# Patient Record
Sex: Male | Born: 1980
Health system: Southern US, Community
[De-identification: ages and names within clinical notes are randomized; demographics above are authoritative.]

---

## 2016-04-18 ENCOUNTER — Emergency Department (HOSPITAL_COMMUNITY)
Admission: EM | Admit: 2016-04-18 | Discharge: 2016-04-18 | Disposition: A | Discharge status: Home / Self Care | Payer: BC Managed Care – PPO | Attending: Emergency Medicine | Admitting: Emergency Medicine

## 2016-04-18 ENCOUNTER — Encounter (HOSPITAL_COMMUNITY): Payer: Self-pay | Admitting: Emergency Medicine

## 2016-04-18 DIAGNOSIS — F17200 Nicotine dependence, unspecified, uncomplicated: Secondary | ICD-10-CM | POA: Diagnosis not present

## 2016-04-18 DIAGNOSIS — T7840XA Allergy, unspecified, initial encounter: Secondary | ICD-10-CM

## 2016-04-18 DIAGNOSIS — Z79899 Other long term (current) drug therapy: Secondary | ICD-10-CM | POA: Diagnosis not present

## 2016-04-18 MED ORDER — EPINEPHRINE 0.3 MG/0.3ML IJ SOAJ
0.3000 mg | Freq: Once | INTRAMUSCULAR | Status: AC
  Administered 2016-04-18: 0.3 mg via INTRAMUSCULAR
  Filled 2016-04-18: qty 0.3

## 2016-04-18 MED ORDER — PREDNISONE 20 MG PO TABS: 40.0000 mg | ORAL_TABLET | Freq: Every day | ORAL | Status: DC

## 2016-04-18 MED ORDER — DIPHENHYDRAMINE HCL 50 MG/ML IJ SOLN
INTRAMUSCULAR | Status: AC
  Administered 2016-04-18: 25 mg via INTRAVENOUS
  Filled 2016-04-18: qty 1

## 2016-04-18 MED ORDER — CETIRIZINE HCL 10 MG PO TABS: 10.0000 mg | ORAL_TABLET | Freq: Every day | ORAL | Status: DC

## 2016-04-18 MED ORDER — METHYLPREDNISOLONE SODIUM SUCC 125 MG IJ SOLR
125.0000 mg | Freq: Once | INTRAMUSCULAR | Status: AC
  Administered 2016-04-18: 125 mg via INTRAVENOUS

## 2016-04-18 MED ORDER — DIPHENHYDRAMINE HCL 50 MG/ML IJ SOLN
25.0000 mg | Freq: Once | INTRAMUSCULAR | Status: AC
  Administered 2016-04-18: 25 mg via INTRAVENOUS

## 2016-04-18 MED ORDER — EPINEPHRINE 0.3 MG/0.3ML IJ SOAJ: 0.3000 mg | Freq: Once | INTRAMUSCULAR | Status: AC

## 2016-04-18 MED ORDER — FAMOTIDINE IN NACL 20-0.9 MG/50ML-% IV SOLN
20.0000 mg | Freq: Once | INTRAVENOUS | Status: AC
  Administered 2016-04-18: 20 mg via INTRAVENOUS
  Filled 2016-04-18: qty 50

## 2016-04-18 MED ORDER — EPINEPHRINE 0.3 MG/0.3ML IJ SOAJ
INTRAMUSCULAR | Status: AC
  Filled 2016-04-18: qty 0.3

## 2016-04-18 MED ORDER — METHYLPREDNISOLONE SODIUM SUCC 125 MG IJ SOLR
INTRAMUSCULAR | Status: AC
  Administered 2016-04-18: 125 mg via INTRAVENOUS
  Filled 2016-04-18: qty 2

## 2016-04-18 NOTE — ED Notes (Signed)
Patient d/c'd self care.  Discussed with family about prescriptions and follow up.  Instructed patient on the use of Epipen.  Patient and family verbalized understanding.

## 2016-04-18 NOTE — ED Provider Notes (Signed)
CSN: [id number]     Arrival date & time 04/18/16  1419 History   First MD Initiated Contact with Patient 04/18/16 1439     Chief Complaint  Patient presents with  . Allergic Reaction     (Consider location/radiation/quality/duration/timing/severity/associated sxs/prior Treatment) HPI Nathan Bryant is a 35 y.o. male who presents for evaluation of allergic reaction. Patient sent here from urgent care facility for evaluation of allergic reaction. Patient reports over the past 3 days he has had bilateral swelling in his eyes. He states that he has seasonal allergies and he has typically had swelling in his eyes and mouth over the past several years when exposed to seasonal allergies, but usually resolves overnight with Benadryl. Current symptoms have not resolved. He denies any mouth swelling, swallowing difficulty, chest pain, shortness of breath, nausea or vomiting, abdominal pain or rash. No vision changes. He denies any other allergies to require an EpiPen. He was seen in urgent care facility, diagnosed with blepharitis, unremarkable fluorescein exam. Given 80 mg Benadryl IM PTA. Patient is in no apparent distress now.  History reviewed. No pertinent past medical history. History reviewed. No pertinent past surgical history. History reviewed. No pertinent family history. Social History  Substance Use Topics  . Smoking status: Light Tobacco Smoker  . Smokeless tobacco: None  . Alcohol Use: None    Review of Systems A 10 point review of systems was completed and was negative except for pertinent positives and negatives as mentioned in the history of present illness     Allergies  Aspirin  Home Medications   Prior to Admission medications   Not on File   BP 154/90 mmHg  Pulse 54  Temp(Src) 98.2 F (36.8 C) (Oral)  Resp 14  SpO2 100% Physical Exam  Constitutional: He is oriented to person, place, and time. He appears well-developed and well-nourished.  HENT:  Head:  Normocephalic and atraumatic.  Mouth/Throat: Oropharynx is clear and moist.  Eyes: Conjunctivae are normal. Pupils are equal, round, and reactive to light. Right eye exhibits no discharge. Left eye exhibits no discharge. No scleral icterus.  Bilateral conjunctival injection, bilateral impressive blepharitis with associated chemosis. Clear watery discharge.  No periorbital tenderness or concern for cellulitis.  Neck: Neck supple.  Cardiovascular: Normal rate, regular rhythm and normal heart sounds.   Pulmonary/Chest: Effort normal and breath sounds normal. No respiratory distress. He has no wheezes. He has no rales.  Abdominal: Soft. There is no tenderness.  Musculoskeletal: He exhibits no tenderness.  Neurological: He is alert and oriented to person, place, and time.  Cranial Nerves II-XII grossly intact  Skin: Skin is warm and dry. No rash noted.  Psychiatric: He has a normal mood and affect.  Nursing note and vitals reviewed.   ED Course  Procedures (including critical care time) Labs Review Labs Reviewed - No data to display  Imaging Review No results found. I have personally reviewed and evaluated these images and lab results as part of my medical decision-making.   EKG Interpretation None     Meds given in ED:  Medications  methylPREDNISolone sodium succinate (SOLU-MEDROL) 125 mg/2 mL injection 125 mg (125 mg Intravenous Given 04/18/16 1540)  famotidine (PEPCID) IVPB 20 mg premix (0 mg Intravenous Stopped 04/18/16 1611)  diphenhydrAMINE (BENADRYL) injection 25 mg (25 mg Intravenous Given 04/18/16 1540)  EPINEPHrine (EPI-PEN) injection 0.3 mg (0.3 mg Intramuscular Given 04/18/16 1619)    Discharge Medication List as of 04/18/2016  6:25 PM    START taking these medications  Details  cetirizine (ZYRTEC ALLERGY) 10 MG tablet Take 1 tablet (10 mg total) by mouth daily., Starting 04/18/2016, Until Discontinued, Print    EPINEPHrine 0.3 mg/0.3 mL IJ SOAJ injection Inject 0.3  mLs (0.3 mg total) into the muscle once., Starting 04/18/2016, Print    predniSONE (DELTASONE) 20 MG tablet Take 2 tablets (40 mg total) by mouth daily., Starting 04/18/2016, Until Discontinued, Print       Filed Vitals:   04/18/16 1819 04/18/16 1824 04/18/16 1922 04/18/16 1924  BP:  146/82 142/82   Pulse: 85 83 68   Temp:    99.1 F (37.3 C)  TempSrc:    Oral  Resp: 16 16    SpO2: 100% 99% 98%     MDM  Patient with apparent allergic reaction to unknown/seasonal allergen? Resulting in blepharitis, chemosis bilaterally. No evidence of pre-or post-septal cellulitis. No evidence of anaphylaxis. Overall appears well. Given Benadryl, Pepcid and Solu-Medrol in the emergency department. He was given 50 mg IM Benadryl at urgent care prior to arrival. Patient reports that he feels his symptoms are improving. He still has gross swelling to bilateral orbits. Discussed with Dr. Rubin Payor, will give epinephrine. Patient given IM 0.3 mg epinephrine at 1619. Reevaluation 1 hour later shows significant improvement in patient's symptoms. Reports he feels much better. Anticipate observation for 3 hours, DC home with steroids, prescription for EpiPen, referral to PCP for further evaluation. Patient reevaluated at 1945, reports he feels much better. Plan to DC home. Final diagnoses:  Allergic reaction, initial encounter        Joycie Peek, PA-C 04/18/16 2020  Benjiman Core, MD 04/18/16 [phone]

## 2016-04-18 NOTE — ED Notes (Signed)
Pt c/o swollen and itchy eyes. Denies swollen throat andSOB. NAD noted. Pt sent from UC where he was given 50mg  Benadryl IM. Pt reports the swelling occurs sometimes after a long walk outside but generally subsides after a day.

## 2016-04-18 NOTE — ED Notes (Addendum)
Patient states that the swelling has gone down.  Patient states "I'm feeling better".  Patient states that has no pain.  Patient states no SOB.  Patient currently watching TV.

## 2016-04-18 NOTE — Discharge Instructions (Signed)
Please take your medications as prescribed. Take your Zyrtec daily for allergies. Take your prednisone to help with inflammation around her eyes. Use your EpiPen as we discussed for severe reactions. Follow-up with your doctor or the [facility] to establish primary care. Return to ED for any new or worsening symptoms as we discussed.  Allergies An allergy is an abnormal reaction to a substance by the body's defense system (immune system). Allergies can develop at any age. WHAT CAUSES ALLERGIES? An allergic reaction happens when the immune system mistakenly reacts to a normally harmless substance, called an allergen, as if it were harmful. The immune system releases antibodies to fight the substance. Antibodies eventually release a chemical called histamine into the bloodstream. The release of histamine is meant to protect the body from infection, but it also causes discomfort. An allergic reaction can be triggered by:  Eating an allergen.  Inhaling an allergen.  Touching an allergen. WHAT TYPES OF ALLERGIES ARE THERE? There are many types of allergies. Common types include:  Seasonal allergies. People with this type of allergy are usually allergic to substances that are only present during certain seasons, such as molds and pollens.  Food allergies.  Drug allergies.  Insect allergies.  Animal dander allergies. WHAT ARE SYMPTOMS OF ALLERGIES? Possible allergy symptoms include:  Swelling of the lips, face, tongue, mouth, or throat.  Sneezing, coughing, or wheezing.  Nasal congestion.  Tingling in the mouth.  Rash.  Itching.  Itchy, red, swollen areas of skin (hives).  Watery eyes.  Vomiting.  Diarrhea.  Dizziness.  Lightheadedness.  Fainting.  Trouble breathing or swallowing.  Chest tightness.  Rapid heartbeat. HOW ARE ALLERGIES DIAGNOSED? Allergies are diagnosed with a medical and family history and one or more of the  following:  Skin tests.  Blood tests.  A food diary. A food diary is a record of all the foods and drinks you have in a day and of all the symptoms you experience.  The results of an elimination diet. An elimination diet involves eliminating foods from your diet and then adding them back in one by one to find out if a certain food causes an allergic reaction. HOW ARE ALLERGIES TREATED? There is no cure for allergies, but allergic reactions can be treated with medicine. Severe reactions usually need to be treated at a hospital. HOW CAN REACTIONS BE PREVENTED? The best way to prevent an allergic reaction is by avoiding the substance you are allergic to. Allergy shots and medicines can also help prevent reactions in some cases. People with severe allergic reactions may be able to prevent a life-threatening reaction called anaphylaxis with a medicine given right after exposure to the allergen.   This information is not intended to replace advice given to you by your health care provider. Make sure you discuss any questions you have with your health care provider.   Document Released: 01/11/2003 Document Revised: 11/08/2014 Document Reviewed: 07/30/2014 Elsevier Interactive Patient Education [employer]2016 [employer]vier Inc.

## 2017-11-06 ENCOUNTER — Other Ambulatory Visit: Payer: Self-pay

## 2017-11-06 ENCOUNTER — Encounter (HOSPITAL_COMMUNITY): Payer: Self-pay

## 2017-11-06 ENCOUNTER — Emergency Department (HOSPITAL_COMMUNITY): Payer: No Typology Code available for payment source

## 2017-11-06 ENCOUNTER — Emergency Department (HOSPITAL_COMMUNITY)
Admission: EM | Admit: 2017-11-06 | Discharge: 2017-11-06 | Disposition: A | Discharge status: Home / Self Care | Payer: No Typology Code available for payment source | Attending: Emergency Medicine | Admitting: Emergency Medicine

## 2017-11-06 DIAGNOSIS — M25562 Pain in left knee: Secondary | ICD-10-CM | POA: Diagnosis not present

## 2017-11-06 DIAGNOSIS — F17200 Nicotine dependence, unspecified, uncomplicated: Secondary | ICD-10-CM | POA: Diagnosis not present

## 2017-11-06 DIAGNOSIS — R51 Headache: Secondary | ICD-10-CM | POA: Diagnosis not present

## 2017-11-06 DIAGNOSIS — Z79899 Other long term (current) drug therapy: Secondary | ICD-10-CM | POA: Diagnosis not present

## 2017-11-06 MED ORDER — ACETAMINOPHEN 325 MG PO TABS
650.0000 mg | ORAL_TABLET | Freq: Once | ORAL | Status: AC
  Administered 2017-11-06: 650 mg via ORAL
  Filled 2017-11-06: qty 2

## 2017-11-06 MED ORDER — FLUTICASONE PROPIONATE 50 MCG/ACT NA SUSP: 1.0000 | Freq: Every day | NASAL | 2 refills | Status: DC
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
## 2018-11-14 ENCOUNTER — Encounter: Payer: Self-pay | Admitting: Pediatric Intensive Care

## 2018-11-14 ENCOUNTER — Other Ambulatory Visit: Payer: Self-pay | Admitting: Critical Care Medicine

## 2018-11-14 MED ORDER — AMLODIPINE BESYLATE 10 MG PO TABS
10.0000 mg | ORAL_TABLET | Freq: Every day | ORAL | 0 refills | Status: DC
Start: 2018-11-14 — End: 2020-02-21

## 2018-11-14 MED ORDER — HYDROCHLOROTHIAZIDE 25 MG PO TABS: 25.0000 mg | ORAL_TABLET | Freq: Every day | ORAL | 0 refills | Status: DC

## 2018-11-14 MED FILL — AMLODIPINE BESYLATE 10 MG T: 10 | 30 days supply | Qty: 30 | Fill #0

## 2018-11-14 MED FILL — HYDROCHLOROTHIAZIDE 25 MG T: 25 | 30 days supply | Qty: 30 | Fill #0

## 2018-11-14 NOTE — Progress Notes (Signed)
Congregational nursing called. Pt with new dx HTN  Needing meds  Rx amlodipine 10mg  /d and HCTZ 25mg  /d  Sent to [facility] outpt pharmacy.  F/u with [place] PCP ASAP

## 2018-11-15 NOTE — Congregational Nurse Program (Signed)
  Dept: [phone]   Congregational Nurse Program Note  Date of Encounter: 11/14/2018  Past Medical History: No past medical history on file.  Encounter Details: New client encounter. Client seeks information regarding referral for PCP. Client states that he currently does not have insurance and missed the [employer] sign up. Client endorses multiple life stressors at present. He states he copes with those by drinking "3-4 shots" sometimes but also goes for walks. Client states he realizes this may not be an effective way to cope with stress. He also states he is having difficulty sleeping. He denies SI/HI but states he is very "sad but doesn't want to be". Client states family history of hypertension. Dr Delford Field notified of BP, CN gave medication to client with directions for use. Client to return to clinic on Tuesday 1/21 to recheck BP and to discuss referral for counseling services. Client verbalizes instructions. Shann Medal RN BSN CNP [phone]

## 2018-11-21 ENCOUNTER — Telehealth: Payer: Self-pay

## 2018-11-21 NOTE — Telephone Encounter (Signed)
Request received from [facility], RN/CN for an appointment for the patient to establish care.   Informed her that an appointment has been scheduled for 12/11/2018 @ 1530

## 2018-11-22 ENCOUNTER — Telehealth: Payer: Self-pay | Admitting: Pediatric Intensive Care

## 2018-11-22 NOTE — Telephone Encounter (Signed)
Call to client to follow up regarding coming to clinic for BP check. Client states he will be able to come to clinic at [facility] Thursday 11/23/2018.

## 2018-12-11 ENCOUNTER — Ambulatory Visit: Payer: Self-pay | Admitting: Internal Medicine
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
## 2019-11-09 ENCOUNTER — Ambulatory Visit: Payer: Self-pay | Admitting: Family Medicine

## 2020-02-21 ENCOUNTER — Other Ambulatory Visit: Payer: Self-pay

## 2020-02-21 ENCOUNTER — Ambulatory Visit (INDEPENDENT_AMBULATORY_CARE_PROVIDER_SITE_OTHER): Payer: 59 | Admitting: Family Medicine

## 2020-02-21 ENCOUNTER — Encounter: Payer: Self-pay | Admitting: Family Medicine

## 2020-02-21 VITALS — BP 170/108 | HR 72 | Temp 96.9°F | Ht 75.0 in | Wt 218.0 lb

## 2020-02-21 DIAGNOSIS — F418 Other specified anxiety disorders: Secondary | ICD-10-CM | POA: Diagnosis not present

## 2020-02-21 DIAGNOSIS — I10 Essential (primary) hypertension: Secondary | ICD-10-CM | POA: Diagnosis not present

## 2020-02-21 DIAGNOSIS — L723 Sebaceous cyst: Secondary | ICD-10-CM | POA: Diagnosis not present

## 2020-02-21 DIAGNOSIS — Z0000 Encounter for general adult medical examination without abnormal findings: Secondary | ICD-10-CM | POA: Diagnosis not present

## 2020-02-21 LAB — COMPREHENSIVE METABOLIC PANEL
ALT: 25 U/L (ref 0–53)
AST: 25 U/L (ref 0–37)
Albumin: 4.7 g/dL (ref 3.5–5.2)
Alkaline Phosphatase: 59 U/L (ref 39–117)
BUN: 8 mg/dL (ref 6–23)
CO2: 26 mEq/L (ref 19–32)
Calcium: 9.5 mg/dL (ref 8.4–10.5)
Chloride: 104 mEq/L (ref 96–112)
Creatinine, Ser: 1 mg/dL (ref 0.40–1.50)
GFR: 83.36 mL/min (ref >60.00)
Glucose, Bld: 89 mg/dL (ref 70–99)
Potassium: 3.9 mEq/L (ref 3.5–5.1)
Sodium: 139 mEq/L (ref 135–145)
Total Bilirubin: 0.8 mg/dL (ref 0.2–1.2)
Total Protein: 7.4 g/dL (ref 6.0–8.3)

## 2020-02-21 LAB — URINALYSIS, ROUTINE W REFLEX MICROSCOPIC
Bilirubin Urine: NEGATIVE
Hgb urine dipstick: NEGATIVE
Ketones, ur: NEGATIVE
Leukocytes,Ua: NEGATIVE
Nitrite: NEGATIVE
RBC / HPF: NONE SEEN (ref 0–?)
Specific Gravity, Urine: 1.01 (ref 1.000–1.030)
Total Protein, Urine: NEGATIVE
Urine Glucose: NEGATIVE
Urobilinogen, UA: 0.2 (ref 0.0–1.0)
WBC, UA: NONE SEEN (ref 0–?)
pH: 7 (ref 5.0–8.0)

## 2020-02-21 LAB — CBC
HCT: 43.8 % (ref 39.0–52.0)
Hemoglobin: 14.7 g/dL (ref 13.0–17.0)
MCHC: 33.6 g/dL (ref 30.0–36.0)
MCV: 93.4 fl (ref 78.0–100.0)
Platelets: 190 10*3/uL (ref 150.0–400.0)
RBC: 4.69 Mil/uL (ref 4.22–5.81)
RDW: 14 % (ref 11.5–15.5)
WBC: 4 10*3/uL (ref 4.0–10.5)

## 2020-02-21 LAB — LIPID PANEL
Cholesterol: 133 mg/dL (ref 0–200)
HDL: 60.9 mg/dL (ref >39.00)
LDL Cholesterol: 63 mg/dL (ref 0–99)
NonHDL: 71.87
Total CHOL/HDL Ratio: 2
Triglycerides: 44 mg/dL (ref 0.0–149.0)
VLDL: 8.8 mg/dL (ref 0.0–40.0)

## 2020-02-21 LAB — TSH: TSH: 0.71 u[IU]/mL (ref 0.35–4.50)

## 2020-02-21 MED ORDER — ESCITALOPRAM OXALATE 10 MG PO TABS
ORAL_TABLET | ORAL | 1 refills | Status: DC
Start: 2020-02-21 — End: 2020-03-28

## 2020-02-21 MED ORDER — AMLODIPINE BESYLATE 10 MG PO TABS: 10.0000 mg | ORAL_TABLET | Freq: Every day | ORAL | 3 refills | Status: DC

## 2020-02-21 NOTE — Patient Instructions (Signed)
Managing Your Hypertension Hypertension is commonly called high blood pressure. This is when the force of your blood pressing against the walls of your arteries is too strong. Arteries are blood vessels that carry blood from your heart throughout your body. Hypertension forces the heart to work harder to pump blood, and may cause the arteries to become narrow or stiff. Having untreated or uncontrolled hypertension can cause heart attack, stroke, kidney disease, and other problems. What are blood pressure readings? A blood pressure reading consists of a higher number over a lower number. Ideally, your blood pressure should be below 120/80. The first ("top") number is called the systolic pressure. It is a measure of the pressure in your arteries as your heart beats. The second ("bottom") number is called the diastolic pressure. It is a measure of the pressure in your arteries as the heart relaxes. What does my blood pressure reading mean? Blood pressure is classified into four stages. Based on your blood pressure reading, your health care provider may use the following stages to determine what type of treatment you need, if any. Systolic pressure and diastolic pressure are measured in a unit called mm Hg. Normal  Systolic pressure: below 119.  Diastolic pressure: below 80. Elevated  Systolic pressure: 417-408.  Diastolic pressure: below 80. Hypertension stage 1  Systolic pressure: 144-818.  Diastolic pressure: 56-31. Hypertension stage 2  Systolic pressure: 497 or above.  Diastolic pressure: 90 or above. What health risks are associated with hypertension? Managing your hypertension is an important responsibility. Uncontrolled hypertension can lead to:  A heart attack.  A stroke.  A weakened blood vessel (aneurysm).  Heart failure.  Kidney damage.  Eye damage.  Metabolic syndrome.  Memory and concentration problems. What changes can I make to manage my  hypertension? Hypertension can be managed by making lifestyle changes and possibly by taking medicines. Your health care provider will help you make a plan to bring your blood pressure within a normal range. Eating and drinking   Eat a diet that is high in fiber and potassium, and low in salt (sodium), added sugar, and fat. An example eating plan is called the DASH (Dietary Approaches to Stop Hypertension) diet. To eat this way: ? Eat plenty of fresh fruits and vegetables. Try to fill half of your plate at each meal with fruits and vegetables. ? Eat whole grains, such as whole wheat pasta, brown rice, or whole grain bread. Fill about one quarter of your plate with whole grains. ? Eat low-fat diary products. ? Avoid fatty cuts of meat, processed or cured meats, and poultry with skin. Fill about one quarter of your plate with lean proteins such as fish, chicken without skin, beans, eggs, and tofu. ? Avoid premade and processed foods. These tend to be higher in sodium, added sugar, and fat.  Reduce your daily sodium intake. Most people with hypertension should eat less than 1,500 mg of sodium a day.  Limit alcohol intake to no more than 1 drink a day for nonpregnant women and 2 drinks a day for men. One drink equals 12 oz of beer, 5 oz of wine, or 1 oz of hard liquor. Lifestyle  Work with your health care provider to maintain a healthy body weight, or to lose weight. Ask what an ideal weight is for you.  Get at least 30 minutes of exercise that causes your heart to beat faster (aerobic exercise) most days of the week. Activities may include walking, swimming, or biking.  Include exercise  to strengthen your muscles (resistance exercise), such as weight lifting, as part of your weekly exercise routine. Try to do these types of exercises for 30 minutes at least 3 days a week.  Do not use any products that contain nicotine or tobacco, such as cigarettes and e-cigarettes. If you need help quitting,  ask your health care provider.  Control any long-term (chronic) conditions you have, such as high cholesterol or diabetes. Monitoring  Monitor your blood pressure at home as told by your health care provider. Your personal target blood pressure may vary depending on your medical conditions, your age, and other factors.  Have your blood pressure checked regularly, as often as told by your health care provider. Working with your health care provider  Review all the medicines you take with your health care provider because there may be side effects or interactions.  Talk with your health care provider about your diet, exercise habits, and other lifestyle factors that may be contributing to hypertension.  Visit your health care provider regularly. Your health care provider can help you create and adjust your plan for managing hypertension. Will I need medicine to control my blood pressure? Your health care provider may prescribe medicine if lifestyle changes are not enough to get your blood pressure under control, and if:  Your systolic blood pressure is 130 or higher.  Your diastolic blood pressure is 80 or higher. Take medicines only as told by your health care provider. Follow the directions carefully. Blood pressure medicines must be taken as prescribed. The medicine does not work as well when you skip doses. Skipping doses also puts you at risk for problems. Contact a health care provider if:  You think you are having a reaction to medicines you have taken.  You have repeated (recurrent) headaches.  You feel dizzy.  You have swelling in your ankles.  You have trouble with your vision. Get help right away if:  You develop a severe headache or confusion.  You have unusual weakness or numbness, or you feel faint.  You have severe pain in your chest or abdomen.  You vomit repeatedly.  You have trouble breathing. Summary  Hypertension is when the force of blood pumping  through your arteries is too strong. If this condition is not controlled, it may put you at risk for serious complications.  Your personal target blood pressure may vary depending on your medical conditions, your age, and other factors. For most people, a normal blood pressure is less than 120/80.  Hypertension is managed by lifestyle changes, medicines, or both. Lifestyle changes include weight loss, eating a healthy, low-sodium diet, exercising more, and limiting alcohol. This information is not intended to replace advice given to you by your health care provider. Make sure you discuss any questions you have with your health care provider. Document Revised: 02/09/2019 Document Reviewed: 09/15/2016 Elsevier Patient Education  2020 [employer]  Preventive Care 21-39 Years Old, Male Preventive care refers to lifestyle choices and visits with your health care provider that can promote health and wellness. This includes:  A yearly physical exam. This is also called an annual well check.  Regular dental and eye exams.  Immunizations.  Screening for certain conditions.  Healthy lifestyle choices, such as eating a healthy diet, getting regular exercise, not using drugs or products that contain nicotine and tobacco, and limiting alcohol use. What can I expect for my preventive care visit? Physical exam Your health care provider will check:  Height and weight. These   may be used to calculate body mass index (BMI), which is a measurement that tells if you are at a healthy weight.  Heart rate and blood pressure.  Your skin for abnormal spots. Counseling Your health care provider may ask you questions about:  Alcohol, tobacco, and drug use.  Emotional well-being.  Home and relationship well-being.  Sexual activity.  Eating habits.  Work and work Statistician. What immunizations do I need?  Influenza (flu) vaccine  This is recommended every year. Tetanus, diphtheria, and  pertussis (Tdap) vaccine  You may need a Td booster every 10 years. Varicella (chickenpox) vaccine  You may need this vaccine if you have not already been vaccinated. Human papillomavirus (HPV) vaccine  If recommended by your health care provider, you may need three doses over 6 months. Measles, mumps, and rubella (MMR) vaccine  You may need at least one dose of MMR. You may also need a second dose. Meningococcal conjugate (MenACWY) vaccine  One dose is recommended if you are 43-9 years old and a Market researcher living in a residence hall, or if you have one of several medical conditions. You may also need additional booster doses. Pneumococcal conjugate (PCV13) vaccine  You may need this if you have certain conditions and were not previously vaccinated. Pneumococcal polysaccharide (PPSV23) vaccine  You may need one or two doses if you smoke cigarettes or if you have certain conditions. Hepatitis A vaccine  You may need this if you have certain conditions or if you travel or work in places where you may be exposed to hepatitis A. Hepatitis B vaccine  You may need this if you have certain conditions or if you travel or work in places where you may be exposed to hepatitis B. Haemophilus influenzae type b (Hib) vaccine  You may need this if you have certain risk factors. You may receive vaccines as individual doses or as more than one vaccine together in one shot (combination vaccines). Talk with your health care provider about the risks and benefits of combination vaccines. What tests do I need? Blood tests  Lipid and cholesterol levels. These may be checked every 5 years starting at age 56.  Hepatitis C test.  Hepatitis B test. Screening   Diabetes screening. This is done by checking your blood sugar (glucose) after you have not eaten for a while (fasting).  Sexually transmitted disease (STD) testing. Talk with your health care provider about your test results,  treatment options, and if necessary, the need for more tests. Follow these instructions at home: Eating and drinking   Eat a diet that includes fresh fruits and vegetables, whole grains, lean protein, and low-fat dairy products.  Take vitamin and mineral supplements as recommended by your health care provider.  Do not drink alcohol if your health care provider tells you not to drink.  If you drink alcohol: ? Limit how much you have to 0-2 drinks a day. ? Be aware of how much alcohol is in your drink. In the [place] one drink equals one 12 oz bottle of beer (355 mL), one 5 oz glass of wine (148 mL), or one 1 oz glass of hard liquor (44 mL). Lifestyle  Take daily care of your teeth and gums.  Stay active. Exercise for at least 30 minutes on 5 or more days each week.  Do not use any products that contain nicotine or tobacco, such as cigarettes, e-cigarettes, and chewing tobacco. If you need help quitting, ask your health care provider.  If you are sexually active, practice safe sex. Use a condom or other form of protection to prevent STIs (sexually transmitted infections). What's next?  Go to your health care provider once a year for a well check visit.  Ask your health care provider how often you should have your eyes and teeth checked.  Stay up to date on all vaccines. This information is not intended to replace advice given to you by your health care provider. Make sure you discuss any questions you have with your health care provider. Document Revised: 10/12/2018 Document Reviewed: 10/12/2018 Elsevier Patient Education  2020 [employer]  Health Maintenance, Male Adopting a healthy lifestyle and getting preventive care are important in promoting health and wellness. Ask your health care provider about:  The right schedule for you to have regular tests and exams.  Things you can do on your own to prevent diseases and keep yourself healthy. What should I know about diet,  weight, and exercise? Eat a healthy diet   Eat a diet that includes plenty of vegetables, fruits, low-fat dairy products, and lean protein.  Do not eat a lot of foods that are high in solid fats, added sugars, or sodium. Maintain a healthy weight Body mass index (BMI) is a measurement that can be used to identify possible weight problems. It estimates body fat based on height and weight. Your health care provider can help determine your BMI and help you achieve or maintain a healthy weight. Get regular exercise Get regular exercise. This is one of the most important things you can do for your health. Most adults should:  Exercise for at least 150 minutes each week. The exercise should increase your heart rate and make you sweat (moderate-intensity exercise).  Do strengthening exercises at least twice a week. This is in addition to the moderate-intensity exercise.  Spend less time sitting. Even light physical activity can be beneficial. Watch cholesterol and blood lipids Have your blood tested for lipids and cholesterol at 39 years of age, then have this test every 5 years. You may need to have your cholesterol levels checked more often if:  Your lipid or cholesterol levels are high.  You are older than 40 years of age.  You are at high risk for heart disease. What should I know about cancer screening? Many types of cancers can be detected early and may often be prevented. Depending on your health history and family history, you may need to have cancer screening at various ages. This may include screening for:  Colorectal cancer.  Prostate cancer.  Skin cancer.  Lung cancer. What should I know about heart disease, diabetes, and high blood pressure? Blood pressure and heart disease  High blood pressure causes heart disease and increases the risk of stroke. This is more likely to develop in people who have high blood pressure readings, are of African descent, or are  overweight.  Talk with your health care provider about your target blood pressure readings.  Have your blood pressure checked: ? Every 3-5 years if you are 18-39 years of age. ? Every year if you are 40 years old or older.  If you are between the ages of 65 and 75 and are a current or former smoker, ask your health care provider if you should have a one-time screening for abdominal aortic aneurysm (AAA). Diabetes Have regular diabetes screenings. This checks your fasting blood sugar level. Have the screening done:  Once every three years after age 45 if you are   at a normal weight and have a low risk for diabetes.  More often and at a younger age if you are overweight or have a high risk for diabetes. What should I know about preventing infection? Hepatitis B If you have a higher risk for hepatitis B, you should be screened for this virus. Talk with your health care provider to find out if you are at risk for hepatitis B infection. Hepatitis C Blood testing is recommended for:  Everyone born from [birth year] through 1965.  Anyone with known risk factors for hepatitis C. Sexually transmitted infections (STIs)  You should be screened each year for STIs, including gonorrhea and chlamydia, if: ? You are sexually active and are younger than 39 years of age. ? You are older than 39 years of age and your health care provider tells you that you are at risk for this type of infection. ? Your sexual activity has changed since you were last screened, and you are at increased risk for chlamydia or gonorrhea. Ask your health care provider if you are at risk.  Ask your health care provider about whether you are at high risk for HIV. Your health care provider may recommend a prescription medicine to help prevent HIV infection. If you choose to take medicine to prevent HIV, you should first get tested for HIV. You should then be tested every 3 months for as long as you are taking the medicine. Follow these  instructions at home: Lifestyle  Do not use any products that contain nicotine or tobacco, such as cigarettes, e-cigarettes, and chewing tobacco. If you need help quitting, ask your health care provider.  Do not use street drugs.  Do not share needles.  Ask your health care provider for help if you need support or information about quitting drugs. Alcohol use  Do not drink alcohol if your health care provider tells you not to drink.  If you drink alcohol: ? Limit how much you have to 0-2 drinks a day. ? Be aware of how much alcohol is in your drink. In the [place] one drink equals one 12 oz bottle of beer (355 mL), one 5 oz glass of wine (148 mL), or one 1 oz glass of hard liquor (44 mL). General instructions  Schedule regular health, dental, and eye exams.  Stay current with your vaccines.  Tell your health care provider if: ? You often feel depressed. ? You have ever been abused or do not feel safe at home. Summary  Adopting a healthy lifestyle and getting preventive care are important in promoting health and wellness.  Follow your health care provider's instructions about healthy diet, exercising, and getting tested or screened for diseases.  Follow your health care provider's instructions on monitoring your cholesterol and blood pressure. This information is not intended to replace advice given to you by your health care provider. Make sure you discuss any questions you have with your health care provider. Document Revised: 10/11/2018 Document Reviewed: 10/11/2018 Elsevier Patient Education  2020 [employer].

## 2020-02-21 NOTE — Progress Notes (Signed)
New Patient Office Visit  Subjective:  Patient ID: Nathan Bryant, male    DOB: 1981-06-15  Age: 39 y.o. MRN: [id number]  CC:  Chief Complaint  Patient presents with  . Establish Care    New patient, CPE    HPI Nathan Bryant presents for establishment of care.  He has been very sad distressed and anxious over the last few years due to a falling out with his significant other.  He had emigrated from [place] 5 years ago to be with this person.  It sounds as though they were unable to have children and this led to some stress in the relationship.  He has been estranged from her over the last few years.  She had placed a restraining order and there has been some legal consequences from his continued attempts to contact her.  He is seeking help with this.  Significant history of hypertension that has been treated with amlodipine in the past.  History reviewed. No pertinent past medical history.  History reviewed. No pertinent surgical history.  Family History  Problem Relation Age of Onset  . Hypertension Mother     Social History   Socioeconomic History  . Marital status: Married    Spouse name: Not on file  . Number of children: Not on file  . Years of education: Not on file  . Highest education level: Not on file  Occupational History  . Not on file  Tobacco Use  . Smoking status: Light Tobacco Smoker  . Smokeless tobacco: Never Used  Substance and Sexual Activity  . Alcohol use: Yes    Alcohol/week: 2.0 standard drinks    Types: 1 Glasses of wine, 1 Shots of liquor per week  . Drug use: Yes    Types: Marijuana  . Sexual activity: Not on file  Other Topics Concern  . Not on file  Social History Narrative  . Not on file   Social Determinants of Health   Financial Resource Strain:   . Difficulty of Paying Living Expenses:   Food Insecurity:   . Worried About Charity fundraiser in the Last Year:   . Arboriculturist in the Last Year:   Transportation Needs:   .  Film/video editor (Medical):   Marland Kitchen Lack of Transportation (Non-Medical):   Physical Activity:   . Days of Exercise per Week:   . Minutes of Exercise per Session:   Stress:   . Feeling of Stress :   Social Connections:   . Frequency of Communication with Friends and Family:   . Frequency of Social Gatherings with Friends and Family:   . Attends Religious Services:   . Active Member of Clubs or Organizations:   . Attends Archivist Meetings:   Marland Kitchen Marital Status:   Intimate Partner Violence:   . Fear of Current or Ex-Partner:   . Emotionally Abused:   Marland Kitchen Physically Abused:   . Sexually Abused:     ROS Review of Systems  Constitutional: Negative for chills, diaphoresis, fatigue, fever and unexpected weight change.  HENT: Negative.   Eyes: Negative for photophobia and visual disturbance.  Respiratory: Negative.   Cardiovascular: Negative.   Gastrointestinal: Negative.   Endocrine: Negative for polyphagia and polyuria.  Genitourinary: Negative.   Musculoskeletal: Negative for gait problem and joint swelling.  Skin: Negative for pallor.  Allergic/Immunologic: Negative for immunocompromised state.  Neurological: Negative for speech difficulty and weakness.  Hematological: Does not bruise/bleed easily.  Psychiatric/Behavioral: Positive for  decreased concentration. Negative for self-injury. The patient is nervous/anxious.    Depression screen [facility] 2/9 02/21/2020 02/21/2020  Decreased Interest 3 1  Down, Depressed, Hopeless 3 2  PHQ - 2 Score 6 3  Altered sleeping 1 -  Tired, decreased energy 1 -  Change in appetite 1 -  Feeling bad or failure about yourself  3 -  Trouble concentrating 1 -  Moving slowly or fidgety/restless 1 -  Suicidal thoughts 0 -  PHQ-9 Score 14 -  Difficult doing work/chores Somewhat difficult -     Objective:   Today's Vitals: BP (!) 170/108   Pulse 72   Temp (!) 96.9 F (36.1 C) (Tympanic)   Ht 6\' 3"  (1.905 m)   Wt 218 lb (98.9 kg)    SpO2 98%   BMI 27.25 kg/m   Physical Exam Vitals and nursing note reviewed.  Constitutional:      General: He is not in acute distress.    Appearance: Normal appearance. He is normal weight. He is not toxic-appearing or diaphoretic.  HENT:     Head: Normocephalic and atraumatic.     Right Ear: Tympanic membrane and ear canal normal.     Left Ear: Tympanic membrane and ear canal normal.  Eyes:     General:        Right eye: No discharge.        Left eye: No discharge.     Extraocular Movements: Extraocular movements intact.     Conjunctiva/sclera: Conjunctivae normal.     Pupils: Pupils are equal, round, and reactive to light.  Neck:     Vascular: No carotid bruit.  Cardiovascular:     Rate and Rhythm: Normal rate and regular rhythm.  Pulmonary:     Effort: Pulmonary effort is normal.     Breath sounds: Normal breath sounds.  Abdominal:     General: Abdomen is flat. Bowel sounds are normal. There is no distension.     Palpations: Abdomen is soft. There is no mass.     Tenderness: There is no abdominal tenderness. There is no guarding or rebound.     Hernia: No hernia is present. There is no hernia in the left inguinal area or right inguinal area.  Genitourinary:    Penis: Circumcised. No hypospadias, erythema, tenderness, discharge, swelling or lesions.      Testes:        Right: Mass, tenderness or swelling not present. Right testis is descended.        Left: Swelling not present. Left testis is descended.    Musculoskeletal:     Cervical back: Normal range of motion and neck supple. No rigidity or tenderness.     Right lower leg: No edema.     Left lower leg: No edema.  Lymphadenopathy:     Cervical: No cervical adenopathy.     Lower Body: No right inguinal adenopathy. No left inguinal adenopathy.  Skin:    General: Skin is warm and dry.  Neurological:     Mental Status: He is alert and oriented to person, place, and time.  Psychiatric:        Mood and Affect: Mood  normal.        Behavior: Behavior normal.     Assessment & Plan:   Problem List Items Addressed This Visit      Cardiovascular and Mediastinum   Essential hypertension - Primary   Relevant Medications   amLODipine (NORVASC) 10 MG tablet   Other Relevant Orders  CBC   Comprehensive metabolic panel   Urinalysis, Routine w reflex microscopic     Other   Depression with anxiety   Relevant Medications   escitalopram (LEXAPRO) 10 MG tablet   Other Relevant Orders   Ambulatory referral to Psychology   Healthcare maintenance   Relevant Orders   HIV Antibody (routine testing w rflx)   Lipid panel   TSH   Urinalysis, Routine w reflex microscopic      Outpatient Encounter Medications as of 02/21/2020  Medication Sig  . Ascorbic Acid (VITAMIN C PO) Take 2 tablets by mouth daily.  . cetirizine (ZYRTEC ALLERGY) 10 MG tablet Take 1 tablet (10 mg total) by mouth daily.  . Tetrahydrozoline HCl (VISINE OP) Apply 1 drop to eye daily as needed (eye irritation).  Marland Kitchen amLODipine (NORVASC) 10 MG tablet Take 1 tablet (10 mg total) by mouth daily.  Marland Kitchen amoxicillin (AMOXIL) 500 MG capsule Take 500 mg by mouth daily.  . CHLORPHENIRAMINE MALEATE PO Take 1 tablet by mouth daily as needed (allergies).  . diphenhydrAMINE (BENADRYL) 25 mg capsule Take 25 mg by mouth 2 (two) times daily as needed for allergies.  Marland Kitchen EPINEPHrine 0.3 mg/0.3 mL IJ SOAJ injection Inject 0.3 mLs (0.3 mg total) into the muscle once. (Patient not taking: Reported on 02/21/2020)  . escitalopram (LEXAPRO) 10 MG tablet Take one daily  . fluticasone (FLONASE) 50 MCG/ACT nasal spray Place 1 spray into both nostrils daily. (Patient not taking: Reported on 02/21/2020)  . hydrochlorothiazide (HYDRODIURIL) 25 MG tablet Take 1 tablet (25 mg total) by mouth daily. (Patient not taking: Reported on 02/21/2020)  . predniSONE (DELTASONE) 20 MG tablet Take 2 tablets (40 mg total) by mouth daily. (Patient not taking: Reported on 02/21/2020)  .  [DISCONTINUED] amLODipine (NORVASC) 10 MG tablet Take 1 tablet (10 mg total) by mouth daily. (Patient not taking: Reported on 02/21/2020)   No facility-administered encounter medications on file as of 02/21/2020.    Follow-up: Return in about 1 month (around 03/22/2020).   Patient agrees to go for counseling.  We will start Lexapro.  Fasting blood work drawn today.  Given information on health maintenance preventative care managing hypertension.  Is much is anything patient needs talking therapy at this point.  Mliss Sax, MD

## 2020-02-22 LAB — HIV ANTIBODY (ROUTINE TESTING W REFLEX): HIV 1&2 Ab, 4th Generation: NONREACTIVE

## 2020-03-28 ENCOUNTER — Other Ambulatory Visit: Payer: Self-pay

## 2020-03-28 ENCOUNTER — Ambulatory Visit: Payer: 59 | Admitting: Family Medicine

## 2020-03-28 ENCOUNTER — Encounter: Payer: Self-pay | Admitting: Family Medicine

## 2020-03-28 VITALS — BP 130/98 | HR 71 | Temp 98.4°F | Ht 75.0 in | Wt 220.6 lb

## 2020-03-28 DIAGNOSIS — L723 Sebaceous cyst: Secondary | ICD-10-CM | POA: Diagnosis not present

## 2020-03-28 DIAGNOSIS — J301 Allergic rhinitis due to pollen: Secondary | ICD-10-CM | POA: Diagnosis not present

## 2020-03-28 DIAGNOSIS — F418 Other specified anxiety disorders: Secondary | ICD-10-CM | POA: Diagnosis not present

## 2020-03-28 DIAGNOSIS — I10 Essential (primary) hypertension: Secondary | ICD-10-CM | POA: Diagnosis not present

## 2020-03-28 MED ORDER — ESCITALOPRAM OXALATE 20 MG PO TABS: 20.0000 mg | ORAL_TABLET | Freq: Every day | ORAL | 2 refills | Status: DC

## 2020-03-28 MED ORDER — PREDNISONE 10 MG PO TABS: 10.0000 mg | ORAL_TABLET | Freq: Every day | ORAL | 0 refills | Status: DC

## 2020-03-28 MED ORDER — PREDNISONE 10 MG PO TABS: 10.0000 mg | ORAL_TABLET | Freq: Every day | ORAL | 0 refills | Status: AC

## 2020-03-28 NOTE — Patient Instructions (Signed)
   Managing Your Hypertension Hypertension is commonly called high blood pressure. This is when the force of your blood pressing against the walls of your arteries is too strong. Arteries are blood vessels that carry blood from your heart throughout your body. Hypertension forces the heart to work harder to pump blood, and may cause the arteries to become narrow or stiff. Having untreated or uncontrolled hypertension can cause heart attack, stroke, kidney disease, and other problems. What are blood pressure readings? A blood pressure reading consists of a higher number over a lower number. Ideally, your blood pressure should be below 120/80. The first ("top") number is called the systolic pressure. It is a measure of the pressure in your arteries as your heart beats. The second ("bottom") number is called the diastolic pressure. It is a measure of the pressure in your arteries as the heart relaxes. What does my blood pressure reading mean? Blood pressure is classified into four stages. Based on your blood pressure reading, your health care provider may use the following stages to determine what type of treatment you need, if any. Systolic pressure and diastolic pressure are measured in a unit called mm Hg. Normal  Systolic pressure: below 120.  Diastolic pressure: below 80. Elevated  Systolic pressure: 120-129.  Diastolic pressure: below 80. Hypertension stage 1  Systolic pressure: 130-139.  Diastolic pressure: 80-89. Hypertension stage 2  Systolic pressure: 140 or above.  Diastolic pressure: 90 or above. What health risks are associated with hypertension? Managing your hypertension is an important responsibility. Uncontrolled hypertension can lead to:  A heart attack.  A stroke.  A weakened blood vessel (aneurysm).  Heart failure.  Kidney damage.  Eye damage.  Metabolic syndrome.  Memory and concentration problems. What changes can I make to manage my  hypertension? Hypertension can be managed by making lifestyle changes and possibly by taking medicines. Your health care provider will help you make a plan to bring your blood pressure within a normal range. Eating and drinking   Eat a diet that is high in fiber and potassium, and low in salt (sodium), added sugar, and fat. An example eating plan is called the DASH (Dietary Approaches to Stop Hypertension) diet. To eat this way: ? Eat plenty of fresh fruits and vegetables. Try to fill half of your plate at each meal with fruits and vegetables. ? Eat whole grains, such as whole wheat pasta, brown rice, or whole grain bread. Fill about one quarter of your plate with whole grains. ? Eat low-fat diary products. ? Avoid fatty cuts of meat, processed or cured meats, and poultry with skin. Fill about one quarter of your plate with lean proteins such as fish, chicken without skin, beans, eggs, and tofu. ? Avoid premade and processed foods. These tend to be higher in sodium, added sugar, and fat.  Reduce your daily sodium intake. Most people with hypertension should eat less than 1,500 mg of sodium a day.  Limit alcohol intake to no more than 1 drink a day for nonpregnant women and 2 drinks a day for men. One drink equals 12 oz of beer, 5 oz of wine, or 1 oz of hard liquor. Lifestyle  Work with your health care provider to maintain a healthy body weight, or to lose weight. Ask what an ideal weight is for you.  Get at least 30 minutes of exercise that causes your heart to beat faster (aerobic exercise) most days of the week. Activities may include walking, swimming, or biking.    Include exercise to strengthen your muscles (resistance exercise), such as weight lifting, as part of your weekly exercise routine. Try to do these types of exercises for 30 minutes at least 3 days a week.  Do not use any products that contain nicotine or tobacco, such as cigarettes and e-cigarettes. If you need help quitting,  ask your health care provider.  Control any long-term (chronic) conditions you have, such as high cholesterol or diabetes. Monitoring  Monitor your blood pressure at home as told by your health care provider. Your personal target blood pressure may vary depending on your medical conditions, your age, and other factors.  Have your blood pressure checked regularly, as often as told by your health care provider. Working with your health care provider  Review all the medicines you take with your health care provider because there may be side effects or interactions.  Talk with your health care provider about your diet, exercise habits, and other lifestyle factors that may be contributing to hypertension.  Visit your health care provider regularly. Your health care provider can help you create and adjust your plan for managing hypertension. Will I need medicine to control my blood pressure? Your health care provider may prescribe medicine if lifestyle changes are not enough to get your blood pressure under control, and if:  Your systolic blood pressure is 130 or higher.  Your diastolic blood pressure is 80 or higher. Take medicines only as told by your health care provider. Follow the directions carefully. Blood pressure medicines must be taken as prescribed. The medicine does not work as well when you skip doses. Skipping doses also puts you at risk for problems. Contact a health care provider if:  You think you are having a reaction to medicines you have taken.  You have repeated (recurrent) headaches.  You feel dizzy.  You have swelling in your ankles.  You have trouble with your vision. Get help right away if:  You develop a severe headache or confusion.  You have unusual weakness or numbness, or you feel faint.  You have severe pain in your chest or abdomen.  You vomit repeatedly.  You have trouble breathing. Summary  Hypertension is when the force of blood pumping  through your arteries is too strong. If this condition is not controlled, it may put you at risk for serious complications.  Your personal target blood pressure may vary depending on your medical conditions, your age, and other factors. For most people, a normal blood pressure is less than 120/80.  Hypertension is managed by lifestyle changes, medicines, or both. Lifestyle changes include weight loss, eating a healthy, low-sodium diet, exercising more, and limiting alcohol. This information is not intended to replace advice given to you by your health care provider. Make sure you discuss any questions you have with your health care provider. Document Revised: 02/09/2019 Document Reviewed: 09/15/2016 Elsevier Patient Education  2020 Elsevier Inc[employer]  

## 2020-03-28 NOTE — Progress Notes (Signed)
Established Patient Office Visit  Subjective:  Patient ID: Nathan Bryant, male    DOB: 06-08-1981  Age: 39 y.o. MRN: [id number]  CC:  Chief Complaint  Patient presents with  . Hypertension    1 month follow up.  Pt c/o lower back of the head pain on both sides.  Constant pain, effecting  work.  Pt explains that head feel heavey during work.  Pt also said that he has not been able to get in touch with previous referral office.    HPI Nathan Bryant presents for follow-up of his depression with anxiety.  Lexapro has helped but he is still feeling anxious and depressed.  He has not heard from psychology for an appointment.  He assures me that his phone number is correct and that his messaging system is working on his phone.  He would like to see somebody who could possibly remove the cyst from his scrotum.  He has been taking his amlodipine.  He is not taking HCTZ.  Tells of ongoing itchy watery eyes postnasal drip headache and sneezing.  Zyrtec seems to help.  He has been taking ibuprofen regularly for the headache.  Headache is nonprogressive.  It is not associated with prodromal symptoms nausea vomiting or light sensitivity.  He has no headache history. History reviewed. No pertinent past medical history.  History reviewed. No pertinent surgical history.  Family History  Problem Relation Age of Onset  . Hypertension Mother     Social History   Socioeconomic History  . Marital status: Married    Spouse name: Not on file  . Number of children: Not on file  . Years of education: Not on file  . Highest education level: Not on file  Occupational History  . Not on file  Tobacco Use  . Smoking status: Light Tobacco Smoker  . Smokeless tobacco: Never Used  Substance and Sexual Activity  . Alcohol use: Yes    Alcohol/week: 2.0 standard drinks    Types: 1 Glasses of wine, 1 Shots of liquor per week  . Drug use: Yes    Types: Marijuana  . Sexual activity: Not on file  Other Topics  Concern  . Not on file  Social History Narrative  . Not on file   Social Determinants of Health   Financial Resource Strain:   . Difficulty of Paying Living Expenses:   Food Insecurity:   . Worried About Programme researcher, broadcasting/film/video in the Last Year:   . Barista in the Last Year:   Transportation Needs:   . Freight forwarder (Medical):   Marland Kitchen Lack of Transportation (Non-Medical):   Physical Activity:   . Days of Exercise per Week:   . Minutes of Exercise per Session:   Stress:   . Feeling of Stress :   Social Connections:   . Frequency of Communication with Friends and Family:   . Frequency of Social Gatherings with Friends and Family:   . Attends Religious Services:   . Active Member of Clubs or Organizations:   . Attends Banker Meetings:   Marland Kitchen Marital Status:   Intimate Partner Violence:   . Fear of Current or Ex-Partner:   . Emotionally Abused:   Marland Kitchen Physically Abused:   . Sexually Abused:     Outpatient Medications Prior to Visit  Medication Sig Dispense Refill  . amLODipine (NORVASC) 10 MG tablet Take 1 tablet (10 mg total) by mouth daily. 90 tablet 3  . Ascorbic  Acid (VITAMIN C PO) Take 2 tablets by mouth daily.    . cetirizine (ZYRTEC ALLERGY) 10 MG tablet Take 1 tablet (10 mg total) by mouth daily. 30 tablet 1  . CHLORPHENIRAMINE MALEATE PO Take 1 tablet by mouth daily as needed (allergies).    . diphenhydrAMINE (BENADRYL) 25 mg capsule Take 25 mg by mouth 2 (two) times daily as needed for allergies.    Marland Kitchen EPINEPHrine 0.3 mg/0.3 mL IJ SOAJ injection Inject 0.3 mLs (0.3 mg total) into the muscle once. 1 Device 1  . fluticasone (FLONASE) 50 MCG/ACT nasal spray Place 1 spray into both nostrils daily. 16 g 2  . escitalopram (LEXAPRO) 10 MG tablet Take one daily 30 tablet 1  . predniSONE (DELTASONE) 20 MG tablet Take 2 tablets (40 mg total) by mouth daily. 10 tablet 0  . amoxicillin (AMOXIL) 500 MG capsule Take 500 mg by mouth daily.    . Tetrahydrozoline  HCl (VISINE OP) Apply 1 drop to eye daily as needed (eye irritation).    . hydrochlorothiazide (HYDRODIURIL) 25 MG tablet Take 1 tablet (25 mg total) by mouth daily. (Patient not taking: Reported on 03/28/2020) 30 tablet 0   No facility-administered medications prior to visit.    Allergies  Allergen Reactions  . Aspirin Anaphylaxis  . Shellfish Allergy Hives and Rash    ROS Review of Systems  Constitutional: Negative.   HENT: Positive for congestion, postnasal drip and sneezing.   Eyes: Positive for itching. Negative for photophobia.  Respiratory: Negative.   Cardiovascular: Negative.   Gastrointestinal: Negative.   Genitourinary: Negative.   Musculoskeletal: Negative for gait problem and joint swelling.  Skin: Negative for pallor and rash.  Neurological: Positive for headaches.  Psychiatric/Behavioral: Positive for dysphoric mood and sleep disturbance. Negative for self-injury and suicidal ideas.      Objective:    Physical Exam  Constitutional: He is oriented to person, place, and time. He appears well-developed and well-nourished. No distress.  HENT:  Head: Normocephalic and atraumatic.  Right Ear: External ear normal.  Left Ear: External ear normal.  Eyes: Conjunctivae are normal. Right eye exhibits no discharge. Left eye exhibits no discharge. No scleral icterus.  Neck: No JVD present. No tracheal deviation present.  Pulmonary/Chest: Effort normal. No stridor.  Neurological: He is alert and oriented to person, place, and time.  Skin: Skin is warm and dry. He is not diaphoretic.  Psychiatric: He has a normal mood and affect. His behavior is normal.    BP (!) 130/98 (BP Location: Left Arm, Patient Position: Sitting, Cuff Size: Normal)   Pulse 71   Temp 98.4 F (36.9 C) (Temporal)   Ht 6\' 3"  (1.905 m)   Wt 220 lb 9.6 oz (100.1 kg)   SpO2 97%   BMI 27.57 kg/m  Wt Readings from Last 3 Encounters:  03/28/20 220 lb 9.6 oz (100.1 kg)  02/21/20 218 lb (98.9 kg)      Health Maintenance Due  Topic Date Due  . COVID-19 Vaccine (1) Never done    There are no preventive care reminders to display for this patient.  Lab Results  Component Value Date   TSH 0.71 02/21/2020   Lab Results  Component Value Date   WBC 4.0 02/21/2020   HGB 14.7 02/21/2020   HCT 43.8 02/21/2020   MCV 93.4 02/21/2020   PLT 190.0 02/21/2020   Lab Results  Component Value Date   NA 139 02/21/2020   K 3.9 02/21/2020   CO2 26 02/21/2020  GLUCOSE 89 02/21/2020   BUN 8 02/21/2020   CREATININE 1.00 02/21/2020   BILITOT 0.8 02/21/2020   ALKPHOS 59 02/21/2020   AST 25 02/21/2020   ALT 25 02/21/2020   PROT 7.4 02/21/2020   ALBUMIN 4.7 02/21/2020   CALCIUM 9.5 02/21/2020   GFR 83.36 02/21/2020   Lab Results  Component Value Date   CHOL 133 02/21/2020   Lab Results  Component Value Date   HDL 60.90 02/21/2020   Lab Results  Component Value Date   LDLCALC 63 02/21/2020   Lab Results  Component Value Date   TRIG 44.0 02/21/2020   Lab Results  Component Value Date   CHOLHDL 2 02/21/2020   No results found for: HGBA1C    Assessment & Plan:   Problem List Items Addressed This Visit      Cardiovascular and Mediastinum   Essential hypertension     Respiratory   Seasonal allergic rhinitis due to pollen   Relevant Medications   predniSONE (DELTASONE) 10 MG tablet     Musculoskeletal and Integument   Scrotal sebaceous cyst   Relevant Orders   Ambulatory referral to Urology     Other   Depression with anxiety - Primary   Relevant Medications   escitalopram (LEXAPRO) 20 MG tablet   Other Relevant Orders   Ambulatory referral to Psychology      Meds ordered this encounter  Medications  . escitalopram (LEXAPRO) 20 MG tablet    Sig: Take 1 tablet (20 mg total) by mouth daily.    Dispense:  30 tablet    Refill:  2  . predniSONE (DELTASONE) 10 MG tablet    Sig: Take 1 tablet (10 mg total) by mouth daily with breakfast for 7 days.     Dispense:  7 tablet    Refill:  0    Follow-up: Return in about 5 weeks (around 05/02/2020).   Have increased Lexapro to 20 mg daily.  Patient will continue Zyrtec and have added prednisone for his allergies.  Continue Norvasc.   Mliss Sax, MD

## 2020-03-28 NOTE — Addendum Note (Signed)
Addended by: Andrez Grime on: 03/28/2020 11:32 AM   Modules accepted: Orders

## 2020-05-02 ENCOUNTER — Encounter: Payer: Self-pay | Admitting: Family Medicine

## 2020-05-02 ENCOUNTER — Other Ambulatory Visit: Payer: Self-pay

## 2020-05-02 ENCOUNTER — Ambulatory Visit: Payer: 59 | Admitting: Family Medicine

## 2020-05-02 VITALS — BP 152/106 | HR 62 | Temp 97.5°F | Ht 75.0 in | Wt 227.4 lb

## 2020-05-02 DIAGNOSIS — I10 Essential (primary) hypertension: Secondary | ICD-10-CM

## 2020-05-02 DIAGNOSIS — F418 Other specified anxiety disorders: Secondary | ICD-10-CM

## 2020-05-02 MED ORDER — ESCITALOPRAM OXALATE 20 MG PO TABS: 20.0000 mg | ORAL_TABLET | Freq: Every day | ORAL | 2 refills | Status: DC

## 2020-05-02 MED ORDER — CHLORTHALIDONE 25 MG PO TABS: 25.0000 mg | ORAL_TABLET | Freq: Every day | ORAL | 2 refills | Status: DC

## 2020-05-02 NOTE — Progress Notes (Signed)
Established Patient Office Visit  Subjective:  Patient ID: Nathan Bryant, male    DOB: Jan 11, 1981  Age: 39 y.o. MRN: [id number]  CC:  Chief Complaint  Patient presents with  . Follow-up    1 month follow up on BP, depression and cyst,  no concerns    HPI Nathan Bryant presents for follow-up of his depression with anxiety and hypertension.  Feeling much better with regards to his anxiety and depression.  Feels like the Lexapro is helping.  Has been taking his Norvasc daily except on the days that he does choose to drink.  Assures me he is not drinking excessively.  He has found the counseling at his church.  No past medical history on file.  No past surgical history on file.  Family History  Problem Relation Age of Onset  . Hypertension Mother     Social History   Socioeconomic History  . Marital status: Married    Spouse name: Not on file  . Number of children: Not on file  . Years of education: Not on file  . Highest education level: Not on file  Occupational History  . Not on file  Tobacco Use  . Smoking status: Light Tobacco Smoker  . Smokeless tobacco: Never Used  Vaping Use  . Vaping Use: Never used  Substance and Sexual Activity  . Alcohol use: Yes    Alcohol/week: 2.0 standard drinks    Types: 1 Glasses of wine, 1 Shots of liquor per week  . Drug use: Yes    Types: Marijuana  . Sexual activity: Not on file  Other Topics Concern  . Not on file  Social History Narrative  . Not on file   Social Determinants of Health   Financial Resource Strain:   . Difficulty of Paying Living Expenses:   Food Insecurity:   . Worried About Programme researcher, broadcasting/film/video in the Last Year:   . Barista in the Last Year:   Transportation Needs:   . Freight forwarder (Medical):   Marland Kitchen Lack of Transportation (Non-Medical):   Physical Activity:   . Days of Exercise per Week:   . Minutes of Exercise per Session:   Stress:   . Feeling of Stress :   Social Connections:     . Frequency of Communication with Friends and Family:   . Frequency of Social Gatherings with Friends and Family:   . Attends Religious Services:   . Active Member of Clubs or Organizations:   . Attends Banker Meetings:   Marland Kitchen Marital Status:   Intimate Partner Violence:   . Fear of Current or Ex-Partner:   . Emotionally Abused:   Marland Kitchen Physically Abused:   . Sexually Abused:     Outpatient Medications Prior to Visit  Medication Sig Dispense Refill  . amLODipine (NORVASC) 10 MG tablet Take 1 tablet (10 mg total) by mouth daily. 90 tablet 3  . Ascorbic Acid (VITAMIN C PO) Take 2 tablets by mouth daily.    . CHLORPHENIRAMINE MALEATE PO Take 1 tablet by mouth daily as needed (allergies).    . diphenhydrAMINE (BENADRYL) 25 mg capsule Take 25 mg by mouth 2 (two) times daily as needed for allergies.    Marland Kitchen EPINEPHrine 0.3 mg/0.3 mL IJ SOAJ injection Inject 0.3 mLs (0.3 mg total) into the muscle once. 1 Device 1  . escitalopram (LEXAPRO) 20 MG tablet Take 1 tablet (20 mg total) by mouth daily. 30 tablet 2  . amoxicillin (  AMOXIL) 500 MG capsule Take 500 mg by mouth daily. (Patient not taking: Reported on 05/02/2020)    . cetirizine (ZYRTEC ALLERGY) 10 MG tablet Take 1 tablet (10 mg total) by mouth daily. (Patient not taking: Reported on 05/02/2020) 30 tablet 1  . fluticasone (FLONASE) 50 MCG/ACT nasal spray Place 1 spray into both nostrils daily. (Patient not taking: Reported on 05/02/2020) 16 g 2  . Tetrahydrozoline HCl (VISINE OP) Apply 1 drop to eye daily as needed (eye irritation). (Patient not taking: Reported on 05/02/2020)     No facility-administered medications prior to visit.    Allergies  Allergen Reactions  . Aspirin Anaphylaxis  . Shellfish Allergy Hives and Rash    ROS Review of Systems  Constitutional: Negative.   HENT: Negative.   Eyes: Negative for photophobia and visual disturbance.  Respiratory: Negative.   Cardiovascular: Negative.   Endocrine: Negative for  polyphagia and polyuria.  Genitourinary: Negative.   Neurological: Negative for light-headedness and headaches.   Depression screen [facility] 2/9 05/02/2020 03/28/2020 02/21/2020  Decreased Interest 0 0 3  Down, Depressed, Hopeless 0 3 3  PHQ - 2 Score 0 3 6  Altered sleeping 0 2 1  Tired, decreased energy 0 0 1  Change in appetite 0 2 1  Feeling bad or failure about yourself  1 2 3   Trouble concentrating 0 3 1  Moving slowly or fidgety/restless 1 3 1   Suicidal thoughts 0 0 0  PHQ-9 Score 2 15 14   Difficult doing work/chores Not difficult at all Somewhat difficult Somewhat difficult      Objective:    Physical Exam Vitals and nursing note reviewed.  Constitutional:      General: He is not in acute distress.    Appearance: Normal appearance. He is not ill-appearing or toxic-appearing.  HENT:     Right Ear: External ear normal.     Left Ear: External ear normal.  Eyes:     General: No scleral icterus.       Right eye: No discharge.        Left eye: No discharge.     Conjunctiva/sclera: Conjunctivae normal.  Pulmonary:     Effort: Pulmonary effort is normal.  Skin:    General: Skin is warm and dry.  Neurological:     Mental Status: He is alert and oriented to person, place, and time.  Psychiatric:        Mood and Affect: Mood normal.        Behavior: Behavior normal.     BP (!) 152/106   Pulse 62   Temp (!) 97.5 F (36.4 C) (Tympanic)   Ht 6\' 3"  (1.905 m)   Wt 227 lb 6.4 oz (103.1 kg)   SpO2 98%   BMI 28.42 kg/m  Wt Readings from Last 3 Encounters:  05/02/20 227 lb 6.4 oz (103.1 kg)  03/28/20 220 lb 9.6 oz (100.1 kg)  02/21/20 218 lb (98.9 kg)     Health Maintenance Due  Topic Date Due  . Hepatitis C Screening  Never done  . COVID-19 Vaccine (1) Never done    There are no preventive care reminders to display for this patient.  Lab Results  Component Value Date   TSH 0.71 02/21/2020   Lab Results  Component Value Date   WBC 4.0 02/21/2020   HGB 14.7  02/21/2020   HCT 43.8 02/21/2020   MCV 93.4 02/21/2020   PLT 190.0 02/21/2020   Lab Results  Component Value Date  NA 139 02/21/2020   K 3.9 02/21/2020   CO2 26 02/21/2020   GLUCOSE 89 02/21/2020   BUN 8 02/21/2020   CREATININE 1.00 02/21/2020   BILITOT 0.8 02/21/2020   ALKPHOS 59 02/21/2020   AST 25 02/21/2020   ALT 25 02/21/2020   PROT 7.4 02/21/2020   ALBUMIN 4.7 02/21/2020   CALCIUM 9.5 02/21/2020   GFR 83.36 02/21/2020   Lab Results  Component Value Date   CHOL 133 02/21/2020   Lab Results  Component Value Date   HDL 60.90 02/21/2020   Lab Results  Component Value Date   LDLCALC 63 02/21/2020   Lab Results  Component Value Date   TRIG 44.0 02/21/2020   Lab Results  Component Value Date   CHOLHDL 2 02/21/2020   No results found for: HGBA1C    Assessment & Plan:   Problem List Items Addressed This Visit      Cardiovascular and Mediastinum   Essential hypertension   Relevant Medications   chlorthalidone (HYGROTON) 25 MG tablet     Other   Depression with anxiety - Primary   Relevant Medications   escitalopram (LEXAPRO) 20 MG tablet      Meds ordered this encounter  Medications  . escitalopram (LEXAPRO) 20 MG tablet    Sig: Take 1 tablet (20 mg total) by mouth daily.    Dispense:  30 tablet    Refill:  2  . chlorthalidone (HYGROTON) 25 MG tablet    Sig: Take 1 tablet (25 mg total) by mouth daily.    Dispense:  30 tablet    Refill:  2    Follow-up: Return in about 2 months (around 07/03/2020), or Take blood pressure medicines and lexapro daily. check and record blood pressures..   Given information on managing her blood pressure.  Advised him to take all medicines every day and moderate alcohol to no more than 2 drinks daily.  We will have his significant other check and record his blood pressures periodically. Mliss Sax, MD

## 2020-05-02 NOTE — Patient Instructions (Signed)
   Managing Your Hypertension Hypertension is commonly called high blood pressure. This is when the force of your blood pressing against the walls of your arteries is too strong. Arteries are blood vessels that carry blood from your heart throughout your body. Hypertension forces the heart to work harder to pump blood, and may cause the arteries to become narrow or stiff. Having untreated or uncontrolled hypertension can cause heart attack, stroke, kidney disease, and other problems. What are blood pressure readings? A blood pressure reading consists of a higher number over a lower number. Ideally, your blood pressure should be below 120/80. The first ("top") number is called the systolic pressure. It is a measure of the pressure in your arteries as your heart beats. The second ("bottom") number is called the diastolic pressure. It is a measure of the pressure in your arteries as the heart relaxes. What does my blood pressure reading mean? Blood pressure is classified into four stages. Based on your blood pressure reading, your health care provider may use the following stages to determine what type of treatment you need, if any. Systolic pressure and diastolic pressure are measured in a unit called mm Hg. Normal  Systolic pressure: below 120.  Diastolic pressure: below 80. Elevated  Systolic pressure: 120-129.  Diastolic pressure: below 80. Hypertension stage 1  Systolic pressure: 130-139.  Diastolic pressure: 80-89. Hypertension stage 2  Systolic pressure: 140 or above.  Diastolic pressure: 90 or above. What health risks are associated with hypertension? Managing your hypertension is an important responsibility. Uncontrolled hypertension can lead to:  A heart attack.  A stroke.  A weakened blood vessel (aneurysm).  Heart failure.  Kidney damage.  Eye damage.  Metabolic syndrome.  Memory and concentration problems. What changes can I make to manage my  hypertension? Hypertension can be managed by making lifestyle changes and possibly by taking medicines. Your health care provider will help you make a plan to bring your blood pressure within a normal range. Eating and drinking   Eat a diet that is high in fiber and potassium, and low in salt (sodium), added sugar, and fat. An example eating plan is called the DASH (Dietary Approaches to Stop Hypertension) diet. To eat this way: ? Eat plenty of fresh fruits and vegetables. Try to fill half of your plate at each meal with fruits and vegetables. ? Eat whole grains, such as whole wheat pasta, brown rice, or whole grain bread. Fill about one quarter of your plate with whole grains. ? Eat low-fat diary products. ? Avoid fatty cuts of meat, processed or cured meats, and poultry with skin. Fill about one quarter of your plate with lean proteins such as fish, chicken without skin, beans, eggs, and tofu. ? Avoid premade and processed foods. These tend to be higher in sodium, added sugar, and fat.  Reduce your daily sodium intake. Most people with hypertension should eat less than 1,500 mg of sodium a day.  Limit alcohol intake to no more than 1 drink a day for nonpregnant women and 2 drinks a day for men. One drink equals 12 oz of beer, 5 oz of wine, or 1 oz of hard liquor. Lifestyle  Work with your health care provider to maintain a healthy body weight, or to lose weight. Ask what an ideal weight is for you.  Get at least 30 minutes of exercise that causes your heart to beat faster (aerobic exercise) most days of the week. Activities may include walking, swimming, or biking.    Include exercise to strengthen your muscles (resistance exercise), such as weight lifting, as part of your weekly exercise routine. Try to do these types of exercises for 30 minutes at least 3 days a week.  Do not use any products that contain nicotine or tobacco, such as cigarettes and e-cigarettes. If you need help quitting,  ask your health care provider.  Control any long-term (chronic) conditions you have, such as high cholesterol or diabetes. Monitoring  Monitor your blood pressure at home as told by your health care provider. Your personal target blood pressure may vary depending on your medical conditions, your age, and other factors.  Have your blood pressure checked regularly, as often as told by your health care provider. Working with your health care provider  Review all the medicines you take with your health care provider because there may be side effects or interactions.  Talk with your health care provider about your diet, exercise habits, and other lifestyle factors that may be contributing to hypertension.  Visit your health care provider regularly. Your health care provider can help you create and adjust your plan for managing hypertension. Will I need medicine to control my blood pressure? Your health care provider may prescribe medicine if lifestyle changes are not enough to get your blood pressure under control, and if:  Your systolic blood pressure is 130 or higher.  Your diastolic blood pressure is 80 or higher. Take medicines only as told by your health care provider. Follow the directions carefully. Blood pressure medicines must be taken as prescribed. The medicine does not work as well when you skip doses. Skipping doses also puts you at risk for problems. Contact a health care provider if:  You think you are having a reaction to medicines you have taken.  You have repeated (recurrent) headaches.  You feel dizzy.  You have swelling in your ankles.  You have trouble with your vision. Get help right away if:  You develop a severe headache or confusion.  You have unusual weakness or numbness, or you feel faint.  You have severe pain in your chest or abdomen.  You vomit repeatedly.  You have trouble breathing. Summary  Hypertension is when the force of blood pumping  through your arteries is too strong. If this condition is not controlled, it may put you at risk for serious complications.  Your personal target blood pressure may vary depending on your medical conditions, your age, and other factors. For most people, a normal blood pressure is less than 120/80.  Hypertension is managed by lifestyle changes, medicines, or both. Lifestyle changes include weight loss, eating a healthy, low-sodium diet, exercising more, and limiting alcohol. This information is not intended to replace advice given to you by your health care provider. Make sure you discuss any questions you have with your health care provider. Document Revised: 02/09/2019 Document Reviewed: 09/15/2016 Elsevier Patient Education  2020 Elsevier Inc[employer]  

## 2020-07-04 ENCOUNTER — Telehealth: Payer: Self-pay | Admitting: Family Medicine

## 2020-07-04 ENCOUNTER — Ambulatory Visit: Payer: 59 | Admitting: Family Medicine

## 2020-07-04 NOTE — Telephone Encounter (Signed)
Patient is requesting a refill for prednisone, escitalopram and hydrochlorothiazide sent to [facility] on [address], please advise. Pt has an appointment on 9/17.  CB [phone]

## 2020-07-09 ENCOUNTER — Other Ambulatory Visit: Payer: Self-pay

## 2020-07-09 DIAGNOSIS — F418 Other specified anxiety disorders: Secondary | ICD-10-CM

## 2020-07-09 DIAGNOSIS — I10 Essential (primary) hypertension: Secondary | ICD-10-CM

## 2020-07-09 MED ORDER — CHLORTHALIDONE 25 MG PO TABS: 25.0000 mg | ORAL_TABLET | Freq: Every day | ORAL | 2 refills | Status: DC

## 2020-07-09 MED ORDER — ESCITALOPRAM OXALATE 20 MG PO TABS: 20.0000 mg | ORAL_TABLET | Freq: Every day | ORAL | 2 refills | Status: DC

## 2020-07-09 MED ORDER — ESCITALOPRAM OXALATE 20 MG PO TABS: 20.0000 mg | ORAL_TABLET | Freq: Every day | ORAL | 0 refills | Status: DC

## 2020-07-09 NOTE — Telephone Encounter (Signed)
Spoke with patient Rx refills sent in. Patient aware that prednisone was sent in for allergies and to be nly taken for 7 days no need for refill on this but others refilled.

## 2020-07-09 NOTE — Telephone Encounter (Signed)
Refill request on pending medication last OV 05/02/20 appointment scheduled for follow up on 07/18/20. Please advise

## 2020-07-09 NOTE — Telephone Encounter (Signed)
Called patient to go over requested refills on medications, no answer LMTCB

## 2020-07-18 ENCOUNTER — Encounter: Payer: Self-pay | Admitting: Family Medicine

## 2020-07-18 ENCOUNTER — Ambulatory Visit (INDEPENDENT_AMBULATORY_CARE_PROVIDER_SITE_OTHER): Payer: 59 | Admitting: Family Medicine

## 2020-07-18 VITALS — BP 132/86 | HR 63 | Temp 97.8°F | Ht 75.0 in | Wt 230.8 lb

## 2020-07-18 DIAGNOSIS — I10 Essential (primary) hypertension: Secondary | ICD-10-CM | POA: Diagnosis not present

## 2020-07-18 DIAGNOSIS — F418 Other specified anxiety disorders: Secondary | ICD-10-CM | POA: Diagnosis not present

## 2020-07-18 MED ORDER — AMLODIPINE BESYLATE 10 MG PO TABS: 10.0000 mg | ORAL_TABLET | Freq: Every day | ORAL | 1 refills | Status: DC

## 2020-07-18 MED ORDER — CHLORTHALIDONE 25 MG PO TABS: 25.0000 mg | ORAL_TABLET | Freq: Every day | ORAL | 1 refills | Status: DC

## 2020-07-18 MED ORDER — ESCITALOPRAM OXALATE 20 MG PO TABS: 20.0000 mg | ORAL_TABLET | Freq: Every day | ORAL | 1 refills | Status: DC

## 2020-07-18 NOTE — Progress Notes (Signed)
Established Patient Office Visit  Subjective:  Patient ID: Nathan Bryant, male    DOB: 07/25/1981  Age: 39 y.o. MRN: [id number]  CC:  Chief Complaint  Patient presents with  . Follow-up    follow up on BP c/o pinching headache on the back right side of head that come and go.     HPI [name] presents for follow-up of hypertension and anxiety with depression.  Blood pressure has been controlled with amlodipine and the chlorthalidone.  He denies trouble taking these medicines.  No swelling in his lower extremities.  He has noted a pain in the back of his head and neck on his missed doses blood pressure medicines.  He does not have a problem with this issue while taking his medications.  Continues with the Lexapro anxiety and depression has responded well to it.  His outlook is much improved taking this medication.  He would like to continue it.  No past medical history on file.  No past surgical history on file.  Family History  Problem Relation Age of Onset  . Hypertension Mother     Social History   Socioeconomic History  . Marital status: Married    Spouse name: Not on file  . Number of children: Not on file  . Years of education: Not on file  . Highest education level: Not on file  Occupational History  . Not on file  Tobacco Use  . Smoking status: Light Tobacco Smoker  . Smokeless tobacco: Never Used  Vaping Use  . Vaping Use: Never used  Substance and Sexual Activity  . Alcohol use: Yes    Alcohol/week: 2.0 standard drinks    Types: 1 Glasses of wine, 1 Shots of liquor per week  . Drug use: Yes    Types: Marijuana  . Sexual activity: Not on file  Other Topics Concern  . Not on file  Social History Narrative  . Not on file   Social Determinants of Health   Financial Resource Strain:   . Difficulty of Paying Living Expenses: Not on file  Food Insecurity:   . Worried About Programme researcher, broadcasting/film/video in the Last Year: Not on file  . Ran Out of Food in the  Last Year: Not on file  Transportation Needs:   . Lack of Transportation (Medical): Not on file  . Lack of Transportation (Non-Medical): Not on file  Physical Activity:   . Days of Exercise per Week: Not on file  . Minutes of Exercise per Session: Not on file  Stress:   . Feeling of Stress : Not on file  Social Connections:   . Frequency of Communication with Friends and Family: Not on file  . Frequency of Social Gatherings with Friends and Family: Not on file  . Attends Religious Services: Not on file  . Active Member of Clubs or Organizations: Not on file  . Attends Banker Meetings: Not on file  . Marital Status: Not on file  Intimate Partner Violence:   . Fear of Current or Ex-Partner: Not on file  . Emotionally Abused: Not on file  . Physically Abused: Not on file  . Sexually Abused: Not on file    Outpatient Medications Prior to Visit  Medication Sig Dispense Refill  . Ascorbic Acid (VITAMIN C PO) Take 2 tablets by mouth daily.    . CHLORPHENIRAMINE MALEATE PO Take 1 tablet by mouth daily as needed (allergies).    . EPINEPHrine 0.3 mg/0.3 mL IJ  SOAJ injection Inject 0.3 mLs (0.3 mg total) into the muscle once. 1 Device 1  . amLODipine (NORVASC) 10 MG tablet Take 1 tablet (10 mg total) by mouth daily. 90 tablet 3  . chlorthalidone (HYGROTON) 25 MG tablet Take 1 tablet (25 mg total) by mouth daily. 30 tablet 2  . escitalopram (LEXAPRO) 20 MG tablet Take 1 tablet (20 mg total) by mouth daily. 30 tablet 0  . diphenhydrAMINE (BENADRYL) 25 mg capsule Take 25 mg by mouth 2 (two) times daily as needed for allergies. (Patient not taking: Reported on 07/18/2020)     No facility-administered medications prior to visit.    Allergies  Allergen Reactions  . Aspirin Anaphylaxis  . Shellfish Allergy Hives and Rash    ROS Review of Systems  Constitutional: Negative.   HENT: Negative.   Eyes: Negative for photophobia and visual disturbance.  Respiratory: Negative.     Cardiovascular: Negative.   Gastrointestinal: Negative.   Endocrine: Negative for polyphagia and polyuria.  Genitourinary: Negative.   Musculoskeletal: Positive for neck pain.  Neurological: Positive for headaches. Negative for light-headedness and numbness.  Hematological: Does not bruise/bleed easily.  Psychiatric/Behavioral: Negative.       Objective:    Physical Exam Vitals and nursing note reviewed.  Constitutional:      General: He is not in acute distress.    Appearance: Normal appearance. He is normal weight. He is not ill-appearing, toxic-appearing or diaphoretic.  HENT:     Head: Normocephalic and atraumatic.     Right Ear: Tympanic membrane, ear canal and external ear normal.     Left Ear: Tympanic membrane, ear canal and external ear normal.     Mouth/Throat:     Mouth: Mucous membranes are moist.     Pharynx: Oropharynx is clear. No oropharyngeal exudate or posterior oropharyngeal erythema.  Eyes:     General: No scleral icterus.       Right eye: No discharge.        Left eye: No discharge.     Conjunctiva/sclera: Conjunctivae normal.     Pupils: Pupils are equal, round, and reactive to light.  Cardiovascular:     Rate and Rhythm: Normal rate and regular rhythm.     Pulses: Normal pulses.  Pulmonary:     Effort: Pulmonary effort is normal.     Breath sounds: Normal breath sounds.  Abdominal:     General: Bowel sounds are normal.  Musculoskeletal:     Cervical back: Normal range of motion and neck supple. No rigidity or tenderness.     Right lower leg: No edema.     Left lower leg: No edema.  Lymphadenopathy:     Cervical: No cervical adenopathy.  Neurological:     Mental Status: He is alert.  Psychiatric:        Mood and Affect: Mood normal.        Behavior: Behavior normal.     BP 132/86   Pulse 63   Temp 97.8 F (36.6 C) (Tympanic)   Ht 6\' 3"  (1.905 m)   Wt 230 lb 12.8 oz (104.7 kg)   SpO2 96%   BMI 28.85 kg/m  Wt Readings from Last 3  Encounters:  07/18/20 230 lb 12.8 oz (104.7 kg)  05/02/20 227 lb 6.4 oz (103.1 kg)  03/28/20 220 lb 9.6 oz (100.1 kg)     Health Maintenance Due  Topic Date Due  . Hepatitis C Screening  Never done  . INFLUENZA VACCINE  Never done  .  COVID-19 Vaccine (2 - Pfizer 2-dose series) 07/15/2020    There are no preventive care reminders to display for this patient.  Lab Results  Component Value Date   TSH 0.71 02/21/2020   Lab Results  Component Value Date   WBC 4.0 02/21/2020   HGB 14.7 02/21/2020   HCT 43.8 02/21/2020   MCV 93.4 02/21/2020   PLT 190.0 02/21/2020   Lab Results  Component Value Date   NA 139 02/21/2020   K 3.9 02/21/2020   CO2 26 02/21/2020   GLUCOSE 89 02/21/2020   BUN 8 02/21/2020   CREATININE 1.00 02/21/2020   BILITOT 0.8 02/21/2020   ALKPHOS 59 02/21/2020   AST 25 02/21/2020   ALT 25 02/21/2020   PROT 7.4 02/21/2020   ALBUMIN 4.7 02/21/2020   CALCIUM 9.5 02/21/2020   GFR 83.36 02/21/2020   Lab Results  Component Value Date   CHOL 133 02/21/2020   Lab Results  Component Value Date   HDL 60.90 02/21/2020   Lab Results  Component Value Date   LDLCALC 63 02/21/2020   Lab Results  Component Value Date   TRIG 44.0 02/21/2020   Lab Results  Component Value Date   CHOLHDL 2 02/21/2020   No results found for: HGBA1C    Assessment & Plan:   Problem List Items Addressed This Visit      Cardiovascular and Mediastinum   Essential hypertension   Relevant Medications   amLODipine (NORVASC) 10 MG tablet   chlorthalidone (HYGROTON) 25 MG tablet     Other   Depression with anxiety - Primary   Relevant Medications   escitalopram (LEXAPRO) 20 MG tablet      Meds ordered this encounter  Medications  . amLODipine (NORVASC) 10 MG tablet    Sig: Take 1 tablet (10 mg total) by mouth daily.    Dispense:  90 tablet    Refill:  1  . chlorthalidone (HYGROTON) 25 MG tablet    Sig: Take 1 tablet (25 mg total) by mouth daily.    Dispense:  90  tablet    Refill:  1  . escitalopram (LEXAPRO) 20 MG tablet    Sig: Take 1 tablet (20 mg total) by mouth daily.    Dispense:  90 tablet    Refill:  1    Follow-up: Return in about 6 months (around 01/15/2021).  Given information on managing his hypertension.  He will try to cut back on the sodium in his diet.  He has purchased a new blood pressure cuff and will be checking his blood pressures  Mliss Sax, MD

## 2020-07-18 NOTE — Patient Instructions (Addendum)
   Managing Your Hypertension Hypertension is commonly called high blood pressure. This is when the force of your blood pressing against the walls of your arteries is too strong. Arteries are blood vessels that carry blood from your heart throughout your body. Hypertension forces the heart to work harder to pump blood, and may cause the arteries to become narrow or stiff. Having untreated or uncontrolled hypertension can cause heart attack, stroke, kidney disease, and other problems. What are blood pressure readings? A blood pressure reading consists of a higher number over a lower number. Ideally, your blood pressure should be below 120/80. The first ("top") number is called the systolic pressure. It is a measure of the pressure in your arteries as your heart beats. The second ("bottom") number is called the diastolic pressure. It is a measure of the pressure in your arteries as the heart relaxes. What does my blood pressure reading mean? Blood pressure is classified into four stages. Based on your blood pressure reading, your health care provider may use the following stages to determine what type of treatment you need, if any. Systolic pressure and diastolic pressure are measured in a unit called mm Hg. Normal  Systolic pressure: below 120.  Diastolic pressure: below 80. Elevated  Systolic pressure: 120-129.  Diastolic pressure: below 80. Hypertension stage 1  Systolic pressure: 130-139.  Diastolic pressure: 80-89. Hypertension stage 2  Systolic pressure: 140 or above.  Diastolic pressure: 90 or above. What health risks are associated with hypertension? Managing your hypertension is an important responsibility. Uncontrolled hypertension can lead to:  A heart attack.  A stroke.  A weakened blood vessel (aneurysm).  Heart failure.  Kidney damage.  Eye damage.  Metabolic syndrome.  Memory and concentration problems. What changes can I make to manage my  hypertension? Hypertension can be managed by making lifestyle changes and possibly by taking medicines. Your health care provider will help you make a plan to bring your blood pressure within a normal range. Eating and drinking   Eat a diet that is high in fiber and potassium, and low in salt (sodium), added sugar, and fat. An example eating plan is called the DASH (Dietary Approaches to Stop Hypertension) diet. To eat this way: ? Eat plenty of fresh fruits and vegetables. Try to fill half of your plate at each meal with fruits and vegetables. ? Eat whole grains, such as whole wheat pasta, brown rice, or whole grain bread. Fill about one quarter of your plate with whole grains. ? Eat low-fat diary products. ? Avoid fatty cuts of meat, processed or cured meats, and poultry with skin. Fill about one quarter of your plate with lean proteins such as fish, chicken without skin, beans, eggs, and tofu. ? Avoid premade and processed foods. These tend to be higher in sodium, added sugar, and fat.  Reduce your daily sodium intake. Most people with hypertension should eat less than 1,500 mg of sodium a day.  Limit alcohol intake to no more than 1 drink a day for nonpregnant women and 2 drinks a day for men. One drink equals 12 oz of beer, 5 oz of wine, or 1 oz of hard liquor. Lifestyle  Work with your health care provider to maintain a healthy body weight, or to lose weight. Ask what an ideal weight is for you.  Get at least 30 minutes of exercise that causes your heart to beat faster (aerobic exercise) most days of the week. Activities may include walking, swimming, or biking.    Include exercise to strengthen your muscles (resistance exercise), such as weight lifting, as part of your weekly exercise routine. Try to do these types of exercises for 30 minutes at least 3 days a week.  Do not use any products that contain nicotine or tobacco, such as cigarettes and e-cigarettes. If you need help quitting,  ask your health care provider.  Control any long-term (chronic) conditions you have, such as high cholesterol or diabetes. Monitoring  Monitor your blood pressure at home as told by your health care provider. Your personal target blood pressure may vary depending on your medical conditions, your age, and other factors.  Have your blood pressure checked regularly, as often as told by your health care provider. Working with your health care provider  Review all the medicines you take with your health care provider because there may be side effects or interactions.  Talk with your health care provider about your diet, exercise habits, and other lifestyle factors that may be contributing to hypertension.  Visit your health care provider regularly. Your health care provider can help you create and adjust your plan for managing hypertension. Will I need medicine to control my blood pressure? Your health care provider may prescribe medicine if lifestyle changes are not enough to get your blood pressure under control, and if:  Your systolic blood pressure is 130 or higher.  Your diastolic blood pressure is 80 or higher. Take medicines only as told by your health care provider. Follow the directions carefully. Blood pressure medicines must be taken as prescribed. The medicine does not work as well when you skip doses. Skipping doses also puts you at risk for problems. Contact a health care provider if:  You think you are having a reaction to medicines you have taken.  You have repeated (recurrent) headaches.  You feel dizzy.  You have swelling in your ankles.  You have trouble with your vision. Get help right away if:  You develop a severe headache or confusion.  You have unusual weakness or numbness, or you feel faint.  You have severe pain in your chest or abdomen.  You vomit repeatedly.  You have trouble breathing. Summary  Hypertension is when the force of blood pumping  through your arteries is too strong. If this condition is not controlled, it may put you at risk for serious complications.  Your personal target blood pressure may vary depending on your medical conditions, your age, and other factors. For most people, a normal blood pressure is less than 120/80.  Hypertension is managed by lifestyle changes, medicines, or both. Lifestyle changes include weight loss, eating a healthy, low-sodium diet, exercising more, and limiting alcohol. This information is not intended to replace advice given to you by your health care provider. Make sure you discuss any questions you have with your health care provider. Document Revised: 02/09/2019 Document Reviewed: 09/15/2016 Elsevier Patient Education  2020 [employer]  

## 2021-02-18 ENCOUNTER — Telehealth: Payer: 59 | Admitting: Family Medicine

## 2021-02-20 ENCOUNTER — Encounter: Payer: Self-pay | Admitting: Family Medicine

## 2021-02-20 ENCOUNTER — Ambulatory Visit: Payer: 59 | Admitting: Family Medicine

## 2021-02-20 ENCOUNTER — Other Ambulatory Visit: Payer: Self-pay

## 2021-02-20 VITALS — BP 152/98 | HR 74 | Temp 98.1°F | Ht 75.0 in | Wt 241.4 lb

## 2021-02-20 DIAGNOSIS — F418 Other specified anxiety disorders: Secondary | ICD-10-CM

## 2021-02-20 DIAGNOSIS — N4889 Other specified disorders of penis: Secondary | ICD-10-CM

## 2021-02-20 DIAGNOSIS — I10 Essential (primary) hypertension: Secondary | ICD-10-CM

## 2021-02-20 DIAGNOSIS — L729 Follicular cyst of the skin and subcutaneous tissue, unspecified: Secondary | ICD-10-CM | POA: Insufficient documentation

## 2021-02-20 MED ORDER — ESCITALOPRAM OXALATE 20 MG PO TABS: 20.0000 mg | ORAL_TABLET | Freq: Every day | ORAL | 1 refills | Status: DC

## 2021-02-20 MED ORDER — CHLORTHALIDONE 25 MG PO TABS: 25.0000 mg | ORAL_TABLET | Freq: Every day | ORAL | 1 refills | Status: DC

## 2021-02-20 MED ORDER — TRIAMCINOLONE ACETONIDE 0.1 % EX CREA: 1.0000 "application " | TOPICAL_CREAM | Freq: Two times a day (BID) | CUTANEOUS | 0 refills | Status: AC

## 2021-02-20 NOTE — Progress Notes (Signed)
[facility] [address] [place] [address] Dept: [phone] Dept Fax: [phone]  Office Visit  Subjective:    Patient ID: Nathan Bryant, male    DOB: 1981-06-04, 40 y.o..   MRN: [id number]  Chief Complaint  Patient presents with  . Acute Visit    C/o having issue in the private area.  Also is having Ha's due to elevated BP.      History of Present Illness:  Patient is in today complaining of itching related to a chronic scrotal lump. He notes that many years ago, he injured his scrotum when he caught the skin in a zipper. Since that time he has had a firm area in the scrotal wall. This has gradually increased in size. He periodically has itching associated with this. Recently the itching has been quite intense. He has considered lancing the growth, as he feels like it has something inside it that needs to come out. The itching was so bad this week that he stayed out of work, thoguh he was reluctant to tell his supervisor what the actual reason was.  Additionally, Mr. Pridgeon notes that some years aback, he was having a circumcision. He had to stop before this was completed due to pain. He has since noticed an issue with how this healed. He has an area of skin bridging and notes that he cannot clean under this.  Mr. Boehle has a history of hypertension. He was previously treated with amlodipine and chlorthalidone. He ran out of the chlorthalidone. He notes recent occipital headaches that come and go.  Mr. Suriano has a past history of depression, previously managed on Lexapro. He has also run out of this medication. He continues to notes issue of feeling down at times, esp. related to marital issues. He also finds himself getting easily frustrated or angry at work. He does feel he did better on the medication. He did try counseling, but was unable to afford to continue this.  Past Medical History: Patient Active Problem List    Diagnosis Date Noted  . Penile adhesions w/skin bridging 02/20/2021  . Scrotal cyst 02/20/2021  . Seasonal allergic rhinitis due to pollen 03/28/2020  . Scrotal sebaceous cyst 03/28/2020  . Essential hypertension 02/21/2020  . Depression with anxiety 02/21/2020  . Healthcare maintenance 02/21/2020   No past surgical history on file.  Family History  Problem Relation Age of Onset  . Hypertension Mother    Outpatient Medications Prior to Visit  Medication Sig Dispense Refill  . amLODipine (NORVASC) 10 MG tablet Take 1 tablet (10 mg total) by mouth daily. 90 tablet 1  . Ascorbic Acid (VITAMIN C PO) Take 2 tablets by mouth daily.    . CHLORPHENIRAMINE MALEATE PO Take 1 tablet by mouth daily as needed (allergies).    . diphenhydrAMINE (BENADRYL) 25 mg capsule Take 25 mg by mouth 2 (two) times daily as needed for allergies.    Marland Kitchen EPINEPHrine 0.3 mg/0.3 mL IJ SOAJ injection Inject 0.3 mLs (0.3 mg total) into the muscle once. 1 Device 1  . chlorthalidone (HYGROTON) 25 MG tablet Take 1 tablet (25 mg total) by mouth daily. (Patient not taking: Reported on 02/20/2021) 90 tablet 1  . escitalopram (LEXAPRO) 20 MG tablet Take 1 tablet (20 mg total) by mouth daily. (Patient not taking: Reported on 02/20/2021) 90 tablet 1   No facility-administered medications prior to visit.   Allergies  Allergen Reactions  . Aspirin Anaphylaxis  . Shellfish Allergy Hives  and Rash     Objective:   Today's Vitals   02/20/21 1043  BP: (!) 152/98  Pulse: 74  Temp: 98.1 F (36.7 C)  TempSrc: Temporal  SpO2: 98%  Weight: 241 lb 6.4 oz (109.5 kg)  Height: 6\' 3"  (1.905 m)   Body mass index is 30.17 kg/m.   General: Well developed, well nourished. No acute distress. GU: Circumcised male. Testicles descended bilaterally and of normal size and consistency. There is skin bridging fromt he phalus to the    rim of the glans around the right side of the glans. There is a 1 cm round, fluctuant mass int he right  scrotal wall. No sign of induraitn or  redness. Psych: Alert and oriented. Normal mood and affect.  Health Maintenance Due  Topic Date Due  . Hepatitis C Screening  Never done  . COVID-19 Vaccine (2 - Pfizer 3-dose series) 07/15/2020     Assessment & Plan:   1. Scrotal cyst The mass appears to be a cyst int he scrotal wall. I will refer him to Urology for excision of the cyst. In the meantime, I will have him use TAC cream to help reduce the itching.  - triamcinolone cream (KENALOG) 0.1 %; Apply 1 application topically 2 (two) times daily.  Dispense: 30 g; Refill: 0 - Ambulatory referral to Urology  2. Penile adhesions w/skin bridging While seeing the urologist, I recommend he discuss a possible revision of his circumcision to remove the skin bridging to improve hygiene.  - Ambulatory referral to Urology  3. Essential hypertension Blood pressure is elevated today. I will restart his chlorthalidone. I recommend he follow-up with Dr. 07/17/2020 in 1 month for reassessment.  - chlorthalidone (HYGROTON) 25 MG tablet; Take 1 tablet (25 mg total) by mouth daily.  Dispense: 90 tablet; Refill: 1  4. Depression with anxiety I will restart his Lexapro. Dr. Doreene Burke can follow-up with this at the next visit.  - escitalopram (LEXAPRO) 20 MG tablet; Take 1 tablet (20 mg total) by mouth daily.  Dispense: 90 tablet; Refill: 1  Doreene Burke, MD

## 2021-03-23 ENCOUNTER — Other Ambulatory Visit: Payer: Self-pay

## 2021-03-23 ENCOUNTER — Encounter: Payer: Self-pay | Admitting: Family Medicine

## 2021-03-23 ENCOUNTER — Ambulatory Visit: Payer: 59 | Admitting: Family Medicine

## 2021-03-23 VITALS — BP 152/98 | HR 65 | Temp 97.8°F | Ht 75.0 in | Wt 238.2 lb

## 2021-03-23 DIAGNOSIS — I10 Essential (primary) hypertension: Secondary | ICD-10-CM

## 2021-03-23 DIAGNOSIS — F418 Other specified anxiety disorders: Secondary | ICD-10-CM

## 2021-03-23 MED ORDER — TRIAMTERENE-HCTZ 37.5-25 MG PO CAPS: 1.0000 | ORAL_CAPSULE | Freq: Every day | ORAL | 1 refills | Status: AC

## 2021-03-23 MED ORDER — ESCITALOPRAM OXALATE 20 MG PO TABS: 20.0000 mg | ORAL_TABLET | Freq: Every day | ORAL | 1 refills | Status: AC

## 2021-03-23 MED ORDER — AMLODIPINE BESYLATE 10 MG PO TABS: 10.0000 mg | ORAL_TABLET | Freq: Every day | ORAL | 1 refills | Status: AC

## 2021-03-23 NOTE — Progress Notes (Signed)
Established Patient Office Visit  Subjective:  Patient ID: Nathan Bryant, male    DOB: 06-13-1981  Age: 40 y.o. MRN: [id number]  CC:  Chief Complaint  Patient presents with  . Follow-up    Follow up on BP,    HPI Nathan Bryant presents for follow-up of depression, hypertension and questions about circumcision.  Patient reports significant difficulties with hygiene regarding his current penile adhesions.  His mother told him that he was essentially circumcised in the dark back home in [place].  He will be seeing a urologist in a few days to discuss circumcision and excision of scrotal cyst.  Patient assures me that he is taking the amlodipine and the chlorthalidone.  He does check his blood pressure at home but cannot remember the last results.  Lexapro continues to work well for him.  History reviewed. No pertinent past medical history.  History reviewed. No pertinent surgical history.  Family History  Problem Relation Age of Onset  . Hypertension Mother     Social History   Socioeconomic History  . Marital status: Married    Spouse name: Not on file  . Number of children: Not on file  . Years of education: Not on file  . Highest education level: Not on file  Occupational History  . Not on file  Tobacco Use  . Smoking status: Light Tobacco Smoker  . Smokeless tobacco: Never Used  Vaping Use  . Vaping Use: Never used  Substance and Sexual Activity  . Alcohol use: Yes    Alcohol/week: 2.0 standard drinks    Types: 1 Glasses of wine, 1 Shots of liquor per week  . Drug use: Yes    Types: Marijuana  . Sexual activity: Not on file  Other Topics Concern  . Not on file  Social History Narrative  . Not on file   Social Determinants of Health   Financial Resource Strain: Not on file  Food Insecurity: Not on file  Transportation Needs: Not on file  Physical Activity: Not on file  Stress: Not on file  Social Connections: Not on file  Intimate Partner Violence: Not on  file    Outpatient Medications Prior to Visit  Medication Sig Dispense Refill  . Ascorbic Acid (VITAMIN C PO) Take 2 tablets by mouth daily.    . CHLORPHENIRAMINE MALEATE PO Take 1 tablet by mouth daily as needed (allergies).    . EPINEPHrine 0.3 mg/0.3 mL IJ SOAJ injection Inject 0.3 mLs (0.3 mg total) into the muscle once. 1 Device 1  . triamcinolone cream (KENALOG) 0.1 % Apply 1 application topically 2 (two) times daily. 30 g 0  . amLODipine (NORVASC) 10 MG tablet Take 1 tablet (10 mg total) by mouth daily. 90 tablet 1  . chlorthalidone (HYGROTON) 25 MG tablet Take 1 tablet (25 mg total) by mouth daily. 90 tablet 1  . escitalopram (LEXAPRO) 20 MG tablet Take 1 tablet (20 mg total) by mouth daily. 90 tablet 1  . diphenhydrAMINE (BENADRYL) 25 mg capsule Take 25 mg by mouth 2 (two) times daily as needed for allergies. (Patient not taking: Reported on 03/23/2021)     No facility-administered medications prior to visit.    Allergies  Allergen Reactions  . Aspirin Anaphylaxis  . Shellfish Allergy Hives and Rash    ROS Review of Systems  Constitutional: Negative.   HENT: Negative.   Eyes: Negative for photophobia and visual disturbance.  Respiratory: Negative.   Cardiovascular: Negative.   Gastrointestinal: Negative.   Endocrine:  Negative for polyphagia and polyuria.  Genitourinary: Negative for difficulty urinating, dysuria and penile pain.  Skin: Negative for color change and pallor.  Neurological: Negative for speech difficulty and weakness.  Psychiatric/Behavioral: Negative for dysphoric mood.      Objective:    Physical Exam Vitals and nursing note reviewed.  Constitutional:      General: He is not in acute distress.    Appearance: Normal appearance. He is not ill-appearing, toxic-appearing or diaphoretic.  HENT:     Head: Normocephalic and atraumatic.     Right Ear: External ear normal.     Left Ear: External ear normal.  Eyes:     General: No scleral icterus.        Right eye: No discharge.        Left eye: No discharge.     Conjunctiva/sclera: Conjunctivae normal.  Cardiovascular:     Rate and Rhythm: Normal rate and regular rhythm.  Pulmonary:     Effort: Pulmonary effort is normal.     Breath sounds: Normal breath sounds.  Musculoskeletal:     Cervical back: No rigidity or tenderness.  Lymphadenopathy:     Cervical: No cervical adenopathy.  Skin:    General: Skin is warm and dry.  Neurological:     Mental Status: He is alert and oriented to person, place, and time.  Psychiatric:        Mood and Affect: Mood normal.        Behavior: Behavior normal.     BP (!) 152/98   Pulse 65   Temp 97.8 F (36.6 C) (Temporal)   Ht 6\' 3"  (1.905 m)   Wt 238 lb 3.2 oz (108 kg)   SpO2 97%   BMI 29.77 kg/m  Wt Readings from Last 3 Encounters:  03/23/21 238 lb 3.2 oz (108 kg)  02/20/21 241 lb 6.4 oz (109.5 kg)  07/18/20 230 lb 12.8 oz (104.7 kg)     Health Maintenance Due  Topic Date Due  . Hepatitis C Screening  Never done  . TETANUS/TDAP  Never done  . COVID-19 Vaccine (3 - Booster for Pfizer series) 12/29/2020    There are no preventive care reminders to display for this patient.  Lab Results  Component Value Date   TSH 0.71 02/21/2020   Lab Results  Component Value Date   WBC 4.0 02/21/2020   HGB 14.7 02/21/2020   HCT 43.8 02/21/2020   MCV 93.4 02/21/2020   PLT 190.0 02/21/2020   Lab Results  Component Value Date   NA 139 02/21/2020   K 3.9 02/21/2020   CO2 26 02/21/2020   GLUCOSE 89 02/21/2020   BUN 8 02/21/2020   CREATININE 1.00 02/21/2020   BILITOT 0.8 02/21/2020   ALKPHOS 59 02/21/2020   AST 25 02/21/2020   ALT 25 02/21/2020   PROT 7.4 02/21/2020   ALBUMIN 4.7 02/21/2020   CALCIUM 9.5 02/21/2020   GFR 83.36 02/21/2020   Lab Results  Component Value Date   CHOL 133 02/21/2020   Lab Results  Component Value Date   HDL 60.90 02/21/2020   Lab Results  Component Value Date   LDLCALC 63 02/21/2020   Lab  Results  Component Value Date   TRIG 44.0 02/21/2020   Lab Results  Component Value Date   CHOLHDL 2 02/21/2020   No results found for: HGBA1C    Assessment & Plan:   Problem List Items Addressed This Visit      Cardiovascular and Mediastinum  Essential hypertension - Primary   Relevant Medications   amLODipine (NORVASC) 10 MG tablet   triamterene-hydrochlorothiazide (DYAZIDE) 37.5-25 MG capsule     Other   Depression with anxiety   Relevant Medications   escitalopram (LEXAPRO) 20 MG tablet      Meds ordered this encounter  Medications  . amLODipine (NORVASC) 10 MG tablet    Sig: Take 1 tablet (10 mg total) by mouth daily.    Dispense:  90 tablet    Refill:  1  . triamterene-hydrochlorothiazide (DYAZIDE) 37.5-25 MG capsule    Sig: Take 1 each (1 capsule total) by mouth daily.    Dispense:  90 capsule    Refill:  1  . escitalopram (LEXAPRO) 20 MG tablet    Sig: Take 1 tablet (20 mg total) by mouth daily.    Dispense:  90 tablet    Refill:  1    Follow-up: Return in about 2 months (around 05/23/2021), or STOP CHLORTHALIDONE.AND START DYAZIDE.Marland Kitchen  Continue Lexapro.  Check and record blood pressures at home perhaps twice a week.  Bring cuff with you next visit in 2 months.  We discussed circumcision in an adult.  He was given information to peruse.  Lexapro has worked well for him.  We will continue.  Mliss Sax, MD

## 2021-03-23 NOTE — Patient Instructions (Addendum)
Managing Your Hypertension Hypertension, also called high blood pressure, is when the force of the blood pressing against the walls of the arteries is too strong. Arteries are blood vessels that carry blood from your heart throughout your body. Hypertension forces the heart to work harder to pump blood and may cause the arteries to become narrow or stiff. Understanding blood pressure readings Your personal target blood pressure may vary depending on your medical conditions, your age, and other factors. A blood pressure reading includes a higher number over a lower number. Ideally, your blood pressure should be below 120/80. You should know that:  The first, or top, number is called the systolic pressure. It is a measure of the pressure in your arteries as your heart beats.  The second, or bottom number, is called the diastolic pressure. It is a measure of the pressure in your arteries as the heart relaxes. Blood pressure is classified into four stages. Based on your blood pressure reading, your health care provider may use the following stages to determine what type of treatment you need, if any. Systolic pressure and diastolic pressure are measured in a unit called mmHg. Normal  Systolic pressure: below 120.  Diastolic pressure: below 80. Elevated  Systolic pressure: 120-129.  Diastolic pressure: below 80. Hypertension stage 1  Systolic pressure: 130-139.  Diastolic pressure: 80-89. Hypertension stage 2  Systolic pressure: 140 or above.  Diastolic pressure: 90 or above. How can this condition affect me? Managing your hypertension is an important responsibility. Over time, hypertension can damage the arteries and decrease blood flow to important parts of the body, including the brain, heart, and kidneys. Having untreated or uncontrolled hypertension can lead to:  A heart attack.  A stroke.  A weakened blood vessel (aneurysm).  Heart failure.  Kidney damage.  Eye  damage.  Metabolic syndrome.  Memory and concentration problems.  Vascular dementia. What actions can I take to manage this condition? Hypertension can be managed by making lifestyle changes and possibly by taking medicines. Your health care provider will help you make a plan to bring your blood pressure within a normal range. Nutrition  Eat a diet that is high in fiber and potassium, and low in salt (sodium), added sugar, and fat. An example eating plan is called the Dietary Approaches to Stop Hypertension (DASH) diet. To eat this way: ? Eat plenty of fresh fruits and vegetables. Try to fill one-half of your plate at each meal with fruits and vegetables. ? Eat whole grains, such as whole-wheat pasta, brown rice, or whole-grain bread. Fill about one-fourth of your plate with whole grains. ? Eat low-fat dairy products. ? Avoid fatty cuts of meat, processed or cured meats, and poultry with skin. Fill about one-fourth of your plate with lean proteins such as fish, chicken without skin, beans, eggs, and tofu. ? Avoid pre-made and processed foods. These tend to be higher in sodium, added sugar, and fat.  Reduce your daily sodium intake. Most people with hypertension should eat less than 1,500 mg of sodium a day.   Lifestyle  Work with your health care provider to maintain a healthy body weight or to lose weight. Ask what an ideal weight is for you.  Get at least 30 minutes of exercise that causes your heart to beat faster (aerobic exercise) most days of the week. Activities may include walking, swimming, or biking.  Include exercise to strengthen your muscles (resistance exercise), such as weight lifting, as part of your weekly exercise routine. Try   to do these types of exercises for 30 minutes at least 3 days a week.  Do not use any products that contain nicotine or tobacco, such as cigarettes, e-cigarettes, and chewing tobacco. If you need help quitting, ask your health care  provider.  Control any long-term (chronic) conditions you have, such as high cholesterol or diabetes.  Identify your sources of stress and find ways to manage stress. This may include meditation, deep breathing, or making time for fun activities.   Alcohol use  Do not drink alcohol if: ? Your health care provider tells you not to drink. ? You are pregnant, may be pregnant, or are planning to become pregnant.  If you drink alcohol: ? Limit how much you use to:  0-1 drink a day for women.  0-2 drinks a day for men. ? Be aware of how much alcohol is in your drink. In the [place] one drink equals one 12 oz bottle of beer (355 mL), one 5 oz glass of wine (148 mL), or one 1 oz glass of hard liquor (44 mL). Medicines Your health care provider may prescribe medicine if lifestyle changes are not enough to get your blood pressure under control and if:  Your systolic blood pressure is 130 or higher.  Your diastolic blood pressure is 80 or higher. Take medicines only as told by your health care provider. Follow the directions carefully. Blood pressure medicines must be taken as told by your health care provider. The medicine does not work as well when you skip doses. Skipping doses also puts you at risk for problems. Monitoring Before you monitor your blood pressure:  Do not smoke, drink caffeinated beverages, or exercise within 30 minutes before taking a measurement.  Use the bathroom and empty your bladder (urinate).  Sit quietly for at least 5 minutes before taking measurements. Monitor your blood pressure at home as told by your health care provider. To do this:  Sit with your back straight and supported.  Place your feet flat on the floor. Do not cross your legs.  Support your arm on a flat surface, such as a table. Make sure your upper arm is at heart level.  Each time you measure, take two or three readings one minute apart and record the results. You may also need to have your  blood pressure checked regularly by your health care provider.   General information  Talk with your health care provider about your diet, exercise habits, and other lifestyle factors that may be contributing to hypertension.  Review all the medicines you take with your health care provider because there may be side effects or interactions.  Keep all visits as told by your health care provider. Your health care provider can help you create and adjust your plan for managing your high blood pressure. Where to find more information  National Heart, Lung, and Blood Institute: www.nhlbi.nih.gov  American Heart Association: www.heart.org Contact a health care provider if:  You think you are having a reaction to medicines you have taken.  You have repeated (recurrent) headaches.  You feel dizzy.  You have swelling in your ankles.  You have trouble with your vision. Get help right away if:  You develop a severe headache or confusion.  You have unusual weakness or numbness, or you feel faint.  You have severe pain in your chest or abdomen.  You vomit repeatedly.  You have trouble breathing. These symptoms may represent a serious problem that is an emergency. Do not wait   to see if the symptoms will go away. Get medical help right away. Call your local emergency services (911 in the [place] Do not drive yourself to the hospital. Summary  Hypertension is when the force of blood pumping through your arteries is too strong. If this condition is not controlled, it may put you at risk for serious complications.  Your personal target blood pressure may vary depending on your medical conditions, your age, and other factors. For most people, a normal blood pressure is less than 120/80.  Hypertension is managed by lifestyle changes, medicines, or both.  Lifestyle changes to help manage hypertension include losing weight, eating a healthy, low-sodium diet, exercising more, stopping smoking, and  limiting alcohol. This information is not intended to replace advice given to you by your health care provider. Make sure you discuss any questions you have with your health care provider. Document Revised: 11/23/2019 Document Reviewed: 09/18/2019 Elsevier Patient Education  2021 [employer]  [employer] Procedures for Primary Care (3rd ed., pp. [phone]). [place], [place]: [employer]."> [employer]inman's Atlas of Urological Surgery (4th ed., pp. [phone]). [place], [place]: [employer].">  Circumcision, Adult  Circumcision is a surgery to remove the foreskin of the penis or to cut the foreskin so the space between the skin and tip of the penis is larger. When the foreskin is cut but not removed, the procedure is called a dorsal incision or dorsal circumcision. A dorsal circumcision leaves the entire foreskin but makes the end of the foreskin looser so it can be pulled back over the head of the penis. Tell a health care provider about:  Any allergies you have.  All medicines you are taking, including vitamins, herbs, eye drops, creams, and over-the-counter medicines.  Any problems you or family members have had with anesthetic medicines.  Any blood disorders you have.  Any surgeries you have had.  Any medical conditions you have, including the common cold or another infection. What are the risks? Generally, this is a safe procedure. However, problems may occur, including:  Bleeding.  Infection.  Pain.  Allergic reactions to medicines.  Opening of the surgical wound. This can occur from an unwanted erection after surgery. What happens before the procedure? Staying hydrated Follow instructions from your health care provider about hydration, which may include:  Up to 2 hours before the procedure - you may continue to drink clear liquids, such as water, clear fruit juice, black coffee, and plain tea.   Eating and drinking restrictions Follow instructions from your health care provider  about eating and drinking, which may include:  8 hours before the procedure - stop eating heavy meals or foods, such as meat, fried foods, or fatty foods.  6 hours before the procedure - stop eating light meals or foods, such as toast or cereal.  6 hours before the procedure - stop drinking milk or drinks that contain milk.  2 hours before the procedure - stop drinking clear liquids. Medicines  Ask your health care provider about: ? Changing or stopping your regular medicines. This is especially important if you take diabetes medicines or blood thinners. ? Taking medicines such as aspirin and ibuprofen. These medicines can thin your blood. Do not take these medicines unless your health care provider tells you to take them. ? Taking over-the-counter medicines, vitamins, herbs, and supplements. General instructions  Do not use any products that contain nicotine or tobacco for at least 4 weeks before the procedure. These products include cigarettes, e-cigarettes, and chewing tobacco. If you  need help quitting, ask your health care provider.  Plan to have someone take you home from the hospital or clinic.  If you will be going home right after the procedure, plan to have someone with you for 24 hours.  Ask your health care provider: ? How your surgery site will be marked. ? What steps will be taken to help prevent infection. These may include:  Washing skin with a germ-killing soap.  Taking antibiotic medicine.   What happens during the procedure?  An IV may be inserted into one of your veins.  You will be given one or more of the following: ? A medicine to help you relax (sedative). ? A medicine to numb the area (local anesthetic). This will be injected with a needle into the skin of your penis.  An incision will be made to remove or cut the foreskin.  Absorbable stitches (sutures) may be used to close the incision.  A bandage (dressing) will be applied to the incision  site.  The IV will be removed. The procedure may vary among health care providers and hospitals. What happens after the procedure?  Your blood pressure, heart rate, breathing rate, and blood oxygen level will be monitored until you leave the hospital or clinic.  Do not get out of bed until your health care provider approves.  If you were given a sedative during the procedure, it can affect you for several hours. Do not drive or operate machinery until your health care provider says that it is safe. Summary  Circumcision is a surgery to remove the foreskin of the penis or to cut the foreskin so the space between the skin and tip of the penis is larger.  If you will be going home right after the procedure, plan to have someone with you for 24 hours.  Absorbable sutures may be used to close the incision after the foreskin has been removed or cut. This information is not intended to replace advice given to you by your health care provider. Make sure you discuss any questions you have with your health care provider. Document Revised: 08/22/2019 Document Reviewed: 08/22/2019 Elsevier Patient Education  2021 [employer].

## 2022-07-22 ENCOUNTER — Ambulatory Visit: Payer: 59 | Admitting: Family Medicine

## 2022-12-01 ENCOUNTER — Ambulatory Visit: Payer: 59 | Admitting: Nurse Practitioner

## 2022-12-01 ENCOUNTER — Telehealth: Payer: Self-pay | Admitting: Family Medicine

## 2022-12-01 NOTE — Telephone Encounter (Signed)
Pt was a no show for an OV with [facility] on 12/01/22, I sent a letter.

## 2022-12-03 ENCOUNTER — Encounter: Payer: Self-pay | Admitting: Family Medicine

## 2022-12-03 NOTE — Telephone Encounter (Signed)
2nd no show, fee generated, letter sent via mail
